# Patient Record
Sex: Male | Born: 2000 | Hispanic: Yes | Marital: Single | Smoking: Never smoker
Health system: Southern US, Community
[De-identification: ages and names within clinical notes are randomized; demographics above are authoritative.]

---
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
## 2018-04-23 ENCOUNTER — Ambulatory Visit (INDEPENDENT_AMBULATORY_CARE_PROVIDER_SITE_OTHER): Payer: Self-pay | Admitting: Family Medicine

## 2018-04-23 ENCOUNTER — Encounter: Payer: Self-pay | Admitting: Family Medicine

## 2018-04-23 VITALS — BP 141/79 | HR 95 | Temp 98.1°F | Resp 17 | Ht 70.0 in | Wt 180.0 lb

## 2018-04-23 DIAGNOSIS — Z711 Person with feared health complaint in whom no diagnosis is made: Secondary | ICD-10-CM

## 2018-04-23 DIAGNOSIS — R030 Elevated blood-pressure reading, without diagnosis of hypertension: Secondary | ICD-10-CM

## 2018-04-23 DIAGNOSIS — F410 Panic disorder [episodic paroxysmal anxiety] without agoraphobia: Secondary | ICD-10-CM

## 2018-04-23 MED ORDER — BUSPIRONE HCL 10 MG PO TABS: 10.0000 mg | ORAL_TABLET | Freq: Two times a day (BID) | ORAL | 0 refills | Status: DC

## 2018-04-23 NOTE — Patient Instructions (Signed)
Thank you for choosing [facility] at [facility] for your medical home!    Marc Campbell was seen by Marc Courts, FNP today.   Marc Campbell's primary care provider  is Marc Neighbors, FNP.   For the best care possible,  you should try to see Marc Courts, FNP  whenever you come to clinic.   We look forward to seeing you again soon!  If you have any questions about your visit today,  please call us at   Or feel free to reach your provider via MyChart.     Panic Attacks Panic attacks are sudden, short feelings of great fear or discomfort. You may have them for no reason when you are relaxed, when you are uneasy (anxious), or when you are sleeping. Follow these instructions at home:  Take all your medicines as told.  Check with your doctor before starting new medicines.  Keep all doctor visits. Contact a doctor if:  You are not able to take your medicines as told.  Your symptoms do not get better.  Your symptoms get worse. Get help right away if:  Your attacks seem different than your normal attacks.  You have thoughts about hurting yourself or others.  You take panic attack medicine and you have a side effect. This information is not intended to replace advice given to you by your health care provider. Make sure you discuss any questions you have with your health care provider. Document Released: 07/20/2010 Document Revised: 11/23/2015 Document Reviewed: 01/29/2013 Elsevier Interactive Patient Education  2017 [employer].    Sexually Transmitted Disease A sexually transmitted disease (STD) is a disease or infection that may be passed (transmitted) from person to person, usually during sexual activity. This may happen by way of saliva, semen, blood, vaginal mucus, or urine. Common STDs include:  Gonorrhea.  Chlamydia.  Syphilis.  HIV and AIDS.  Genital herpes.  Hepatitis B and C.  Trichomonas.  Human papillomavirus (HPV).  Pubic  lice.  Scabies.  Mites.  Bacterial vaginosis.  What are the causes? An STD may be caused by bacteria, a virus, or parasites. STDs are often transmitted during sexual activity if one person is infected. However, they may also be transmitted through nonsexual means. STDs may be transmitted after:  Sexual intercourse with an infected person.  Sharing sex toys with an infected person.  Sharing needles with an infected person or using unclean piercing or tattoo needles.  Having intimate contact with the genitals, mouth, or rectal areas of an infected person.  Exposure to infected fluids during birth.  What are the signs or symptoms? Different STDs have different symptoms. Some people may not have any symptoms. If symptoms are present, they may include:  Painful or bloody urination.  Pain in the pelvis, abdomen, vagina, anus, throat, or eyes.  A skin rash, itching, or irritation.  Growths, ulcerations, blisters, or sores in the genital and anal areas.  Abnormal vaginal discharge with or without bad odor.  Penile discharge in men.  Fever.  Pain or bleeding during sexual intercourse.  Swollen glands in the groin area.  Yellow skin and eyes (jaundice). This is seen with hepatitis.  Swollen testicles.  Infertility.  Sores and blisters in the mouth.  How is this diagnosed? To make a diagnosis, your health care provider may:  Take a medical history.  Perform a physical exam.  Take a sample of any discharge to examine.  Swab the throat, cervix, opening to the penis,  rectum, or vagina for testing.  Test a sample of your first morning urine.  Perform blood tests.  Perform a Pap test, if this applies.  Perform a colposcopy.  Perform a laparoscopy.  How is this treated? Treatment depends on the STD. Some STDs may be treated but not cured.  Chlamydia, gonorrhea, trichomonas, and syphilis can be cured with antibiotic medicine.  Genital herpes, hepatitis, and  HIV can be treated, but not cured, with prescribed medicines. The medicines lessen symptoms.  Genital warts from HPV can be treated with medicine or by freezing, burning (electrocautery), or surgery. Warts may come back.  HPV cannot be cured with medicine or surgery. However, abnormal areas may be removed from the cervix, vagina, or vulva.  If your diagnosis is confirmed, your recent sexual partners need treatment. This is true even if they are symptom-free or have a negative culture or evaluation. They should not have sex until their health care providers say it is okay.  Your health care provider may test you for infection again 3 months after treatment.  How is this prevented? Take these steps to reduce your risk of getting an STD:  Use latex condoms, dental dams, and water-soluble lubricants during sexual activity. Do not use petroleum jelly or oils.  Avoid having multiple sex partners.  Do not have sex with someone who has other sex partners.  Do not have sex with anyone you do not know or who is at high risk for an STD.  Avoid risky sex practices that can break your skin.  Do not have sex if you have open sores on your mouth or skin.  Avoid drinking too much alcohol or taking illegal drugs. Alcohol and drugs can affect your judgment and put you in a vulnerable position.  Avoid engaging in oral and anal sex acts.  Get vaccinated for HPV and hepatitis. If you have not received these vaccines in the past, talk to your health care provider about whether one or both might be right for you.  If you are at risk of being infected with HIV, it is recommended that you take a prescription medicine daily to prevent HIV infection. This is called pre-exposure prophylaxis (PrEP). You are considered at risk if: ? You are a man who has sex with other men (MSM). ? You are a heterosexual man or woman and are sexually active with more than one partner. ? You take drugs by injection. ? You are  sexually active with a partner who has HIV.  Talk with your health care provider about whether you are at high risk of being infected with HIV. If you choose to begin PrEP, you should first be tested for HIV. You should then be tested every 3 months for as long as you are taking PrEP.  Contact a health care provider if:  See your health care provider.  Tell your sexual partner(s). They should be tested and treated for any STDs.  Do not have sex until your health care provider says it is okay. Get help right away if: Contact your health care provider right away if:  You have severe abdominal pain.  You are a man and notice swelling or pain in your testicles.  You are a woman and notice swelling or pain in your vagina.  This information is not intended to replace advice given to you by your health care provider. Make sure you discuss any questions you have with your health care provider. Document Released: 09/07/2002 Document Revised: 01/05/2016  Document Reviewed: 01/05/2013 Elsevier Interactive Patient Education  [employer].

## 2018-04-23 NOTE — Progress Notes (Signed)
Marc Campbell, is a 17 y.o. male  ZOX:[id number]  [id number]:[id number]  DOB - 2000-07-12  CC:  Chief Complaint  Patient presents with  . Establish Care  . Panic Attack    states it happens recently when he went to the beach       HPI: Marc Campbell is a 17 y.o. male is here today to establish care.   ROMEO ZIELINSKI does not have a problem list on file.   Today's visit:  Patient is a very poor historian and is speaking rapidly and required constant redirection to determine the reason for visit today. Need to be screened for STI. Last sexual encounter 2 months ago and experienced dysuria at that time, however no longer symptomatic.Concern panic attacks. He is experiencing rapid thoughts and worry about health. He is sleeping throughout the night. 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 No prior history of depression or anxiety. He denies suicidal or homicidal ideations.Patient denies new headaches, chest pain, abdominal pain, nausea, new weakness , numbness or tingling, SOB, edema, or worrisome cough.   Current medications: Current Outpatient Medications:  .  busPIRone (BUSPAR) 10 MG tablet, Take 1 tablet (10 mg total) by mouth 2 (two) times daily., Disp: 60 tablet, Rfl: 0   Pertinent family medical history: family history is not on file.   No Known Allergies  Social History   Socioeconomic History  . Marital status: Single    Spouse name: Not on file  . Number of children: Not on file  . Years of education: Not on file  . Highest education level: Not on file  Occupational History  . Not on file  Social Needs  . Financial resource strain: Not on file  . Food insecurity:    Worry: Not on file    Inability: Not on file  . Transportation needs:    Medical: Not on file    Non-medical: Not on file  Tobacco Use  . Smoking status: Never Smoker  . Smokeless tobacco: Never Used  Substance and Sexual Activity  . Alcohol use: No  . Drug use: No  . Sexual activity: Never   Lifestyle  . Physical activity:    Days per week: Not on file    Minutes per session: Not on file  . Stress: Not on file  Relationships  . Social connections:    Talks on phone: Not on file    Gets together: Not on file    Attends religious service: Not on file    Active member of club or organization: Not on file    Attends meetings of clubs or organizations: Not on file    Relationship status: Not on file  . Intimate partner violence:    Fear of current or ex partner: Not on file    Emotionally abused: Not on file    Physically abused: Not on file    Forced sexual activity: Not on file  Other Topics Concern  . Not on file  Social History Narrative  . Not on file    Review of Systems: Constitutional: Negative for fever, chills, diaphoresis, activity change, appetite change and fatigue. Cardiovascular: Negative for chest pain, palpitations and leg swelling. Gastrointestinal: Negative for abdominal distention. Genitourinary: Negative for dysuria, urgency, frequency, hematuria, flank pain, decreased urine volume, difficulty urinating. Psychiatric/Behavioral: anxious and worry.panic attacks  Objective:   Vitals:   04/23/18 1353  BP: (!) 141/79  Pulse: 95  Resp: 17  Temp: 98.1 F (36.7 C)  SpO2: 98%  BP Readings from Last 3 Encounters:  04/23/18 (!) 141/79  04/14/17 133/73  03/06/12 98/68 (24 %, Z = -0.72 /  76 %, Z = 0.69)*   *BP percentiles are based on the August 2017 AAP Clinical Practice Guideline for boys    Filed Weights   04/23/18 1353  Weight: 180 lb (81.6 kg)      Physical Exam: Constitutional: Patient appears well-developed and well-nourished. No distress. HENT: Normocephalic, atraumatic, External right and left ear normal. Oropharynx is clear and moist.  Eyes: Conjunctivae and EOM are normal. PERRLA, no scleral icterus. Neck: Normal ROM. Neck supple. No JVD. No tracheal deviation. No thyromegaly. CVS: RRR, S1/S2 +, no murmurs, no gallops, no  carotid bruit.  Pulmonary: Effort and breath sounds normal, no stridor, rhonchi, wheezes, rales.  Abdominal: Soft. BS +, no distension, tenderness, rebound or guarding.  Musculoskeletal: Normal range of motion. No edema and no tenderness.  Skin: Skin is warm and dry. No rash noted. Not diaphoretic. No erythema. No pallor. Psychiatric: inattentiveness, anxious, rapid speech    Assessment and plan:  1. Panic attacks, new reoccurring without an identifiable cause. Trial Buspirone 10 mg twice daily for anxiety. Patient may benefit from SSRI. Given his inability to focus, he would benefit from a psychological consult.  He is uninsured. May benefit from [facility]  2. Concern about STD in male without diagnosis - GC/Chlamydia Probe Amp([facility]) - Urinalysis - HIV antibody (with reflex); Future - RPR; Future  3. Elevated BP without diagnosis of hypertension, will continue to monitor at subsequent follow-up. UA + protein. Will repeat. If protein persists, obtain urine microalbumin.   Meds ordered this encounter  Medications  . busPIRone (BUSPAR) 10 MG tablet    Sig: Take 1 tablet (10 mg total) by mouth 2 (two) times daily.    Dispense:  60 tablet    Refill:  0    Orders Placed This Encounter  Procedures  . GC/Chlamydia Probe Amp([facility])  . Urinalysis  . HIV antibody (with reflex)    Standing Status:   Future    Standing Expiration Date:   04/24/2019  . RPR    Standing Status:   Future    Standing Expiration Date:   04/24/2019    Return in about 6 weeks (around 06/04/2018) for anxiety and blood pressure recheck.  The patient was given clear instructions to go to ER or return to medical center if symptoms don't improve, worsen or new problems develop. The patient verbalized understanding.The patient was advised  to call and obtain lab results if they haven't heard anything from out office within 7-10 business days.    Joaquin Courts, FNP [facility] [address], [place] [address] [phone]: [phone]    This note has been created with Dragon speech recognition software and Paediatric nurse. Any transcriptional errors are unintentional.

## 2018-04-24 LAB — URINALYSIS
BILIRUBIN UA: NEGATIVE
GLUCOSE, UA: NEGATIVE
KETONES UA: NEGATIVE
Leukocytes, UA: NEGATIVE
Nitrite, UA: NEGATIVE
RBC, UA: NEGATIVE
Specific Gravity, UA: 1.025 (ref 1.005–1.030)
UUROB: 1 mg/dL (ref 0.2–1.0)
pH, UA: >=9 — AB (ref 5.0–7.5)

## 2018-04-25 LAB — GC/CHLAMYDIA PROBE AMP
CHLAMYDIA, DNA PROBE: POSITIVE — AB
Neisseria gonorrhoeae by PCR: NEGATIVE

## 2018-04-27 ENCOUNTER — Telehealth: Payer: Self-pay | Admitting: Family Medicine

## 2018-04-27 MED ORDER — AZITHROMYCIN 500 MG PO TABS: 1000.0000 mg | ORAL_TABLET | Freq: Once | ORAL | 0 refills | Status: AC

## 2018-04-27 NOTE — Telephone Encounter (Signed)
Positive for chlamydia. Azithromycin 1 gram sent to pharmacy. Please notify [facility] dept. Patient should advise all sexual partners and they will need treatment. He should uses barrier protection with any future sexual encounters

## 2018-04-27 NOTE — Addendum Note (Signed)
Addended by: Bing Neighbors on: 04/27/2018 07:52 AM   Modules accepted: Orders

## 2018-04-27 NOTE — Telephone Encounter (Signed)
No answer/No VM.  Faxed Communicable Disease Report to [facility] Department([phone])

## 2018-04-28 NOTE — Telephone Encounter (Signed)
No answer. No VM

## 2018-04-30 ENCOUNTER — Ambulatory Visit: Payer: Self-pay | Attending: Family Medicine

## 2018-04-30 DIAGNOSIS — R030 Elevated blood-pressure reading, without diagnosis of hypertension: Secondary | ICD-10-CM

## 2018-04-30 DIAGNOSIS — F410 Panic disorder [episodic paroxysmal anxiety] without agoraphobia: Secondary | ICD-10-CM

## 2018-04-30 DIAGNOSIS — Z711 Person with feared health complaint in whom no diagnosis is made: Secondary | ICD-10-CM

## 2018-04-30 NOTE — Telephone Encounter (Signed)
Patient notified of lab results & recommendations. Expressed understanding. 

## 2018-05-01 ENCOUNTER — Telehealth: Payer: Self-pay | Admitting: Family Medicine

## 2018-05-01 LAB — HIV ANTIBODY (ROUTINE TESTING W REFLEX): HIV Screen 4th Generation wRfx: NONREACTIVE

## 2018-05-01 LAB — THYROID PANEL WITH TSH
FREE THYROXINE INDEX: 1.9 (ref 1.2–4.9)
T3 Uptake Ratio: 27 % (ref 24–39)
T4, Total: 7 ug/dL (ref 4.5–12.0)
TSH: 0.719 u[IU]/mL (ref 0.450–4.500)

## 2018-05-01 LAB — RPR: RPR: NONREACTIVE

## 2018-05-01 NOTE — Telephone Encounter (Signed)
erroneous

## 2018-06-04 ENCOUNTER — Ambulatory Visit: Payer: Self-pay | Admitting: Family Medicine

## 2018-07-08 ENCOUNTER — Ambulatory Visit: Payer: Self-pay | Admitting: Family Medicine

## 2018-07-17 ENCOUNTER — Ambulatory Visit (INDEPENDENT_AMBULATORY_CARE_PROVIDER_SITE_OTHER): Payer: Self-pay | Admitting: Family Medicine

## 2018-07-17 ENCOUNTER — Encounter: Payer: Self-pay | Admitting: Family Medicine

## 2018-07-17 VITALS — BP 129/75 | HR 97 | Resp 18 | Ht 70.0 in | Wt 182.8 lb

## 2018-07-17 DIAGNOSIS — F410 Panic disorder [episodic paroxysmal anxiety] without agoraphobia: Secondary | ICD-10-CM

## 2018-07-17 DIAGNOSIS — R45851 Suicidal ideations: Secondary | ICD-10-CM

## 2018-07-17 DIAGNOSIS — F411 Generalized anxiety disorder: Secondary | ICD-10-CM

## 2018-07-17 DIAGNOSIS — F321 Major depressive disorder, single episode, moderate: Secondary | ICD-10-CM

## 2018-07-17 MED ORDER — BUSPIRONE HCL 10 MG PO TABS: 10.0000 mg | ORAL_TABLET | Freq: Two times a day (BID) | ORAL | 1 refills | Status: DC

## 2018-07-17 MED ORDER — BUPROPION HCL ER (XL) 150 MG PO TB24: 150.0000 mg | ORAL_TABLET | Freq: Every day | ORAL | 1 refills | Status: DC

## 2018-07-17 NOTE — Progress Notes (Signed)
Established Patient Office Visit  Subjective:  Patient ID: Marc Campbell, male    DOB: 2000-08-12  Age: 18 y.o. MRN: [id number]  CC:  Chief Complaint  Patient presents with  . Anxiety    feels like Buspar is working ok but wears off during the day. maybe a higher dose would work better    HPI Marc Campbell presents for anxiety and depression follow-up During last office visit patient was diagnosed and treatment was initiated for anxiety disorder which had been going on previously undiagnosed and untreated.  During discussion today patient revealed that he is also suffering from some depression accompanied by some suicidal ideations during periods of stress.  Today he endorses a positive family history of depression with a 52 year old brother who has recently attempted suicide unsuccessfully.  His brother is currently being treated for anxiety and depression.  He cannot identify any particular triggers or source of the anxiety and depression other than stressors at work.  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 He has difficulty explaining cause of symptoms and reasons he feels suicidal at times. No specific pain for suicide. He continues to take Buspar which he reports helps with symptoms. He is not taking medication as prescribed and takes as needed.  Family History  Problem Relation Age of Onset  . Healthy Mother   . Healthy Father   . Anxiety disorder Brother   . Cancer Neg Hx   . Heart disease Neg Hx   . Stroke Neg Hx     Social History   Socioeconomic History  . Marital status: Single    Spouse name: Not on file  . Number of children: Not on file  . Years of education: Not on file  . Highest education level: Not on file  Occupational History  . Not on file  Social Needs  . Financial resource strain: Not on file  . Food insecurity:    Worry: Not on file    Inability: Not on file  . Transportation needs:    Medical: Not on file     Non-medical: Not on file  Tobacco Use  . Smoking status: Never Smoker  . Smokeless tobacco: Never Used  Substance and Sexual Activity  . Alcohol use: No  . Drug use: No  . Sexual activity: Never  Lifestyle  . Physical activity:    Days per week: Not on file    Minutes per session: Not on file  . Stress: Not on file  Relationships  . Social connections:    Talks on phone: Not on file    Gets together: Not on file    Attends religious service: Not on file    Active member of club or organization: Not on file    Attends meetings of clubs or organizations: Not on file    Relationship status: Not on file  . Intimate partner violence:    Fear of current or ex partner: Not on file    Emotionally abused: Not on file    Physically abused: Not on file    Forced sexual activity: Not on file  Other Topics Concern  . Not on file  Social History Narrative  . Not on file    Outpatient Medications Prior to Visit  Medication Sig Dispense Refill  . busPIRone (BUSPAR) 10 MG tablet Take 1 tablet (10 mg total) by mouth 2 (two) times daily. 60 tablet 0   No facility-administered medications prior to  visit.     No Known Allergies  ROS Review of Systems Pertinent negatives listed in HPI   Objective:    Physical Exam BP 129/75   Pulse 97   Resp 18   Ht 5\' 10"  (1.778 m)   Wt 182 lb 12.8 oz (82.9 kg)   SpO2 98%   BMI 26.23 kg/m    General appearance: alert, well developed, well nourished, cooperative and in no distress Head: Normocephalic, without obvious abnormality, atraumatic Respiratory: Respirations even and unlabored, normal respiratory rate Extremities: No gross deformities Skin: Skin color, texture, turgor normal. No rashes seen  Psych: Inattentiveness, skipping from subject to subject, poor eye contact, rapid speech Neurologic: Mental status: Alert, oriented to person, place, and time, thought content appropriate.  Wt Readings from Last 3 Encounters:  07/17/18 182  lb 12.8 oz (82.9 kg) (83 %, Z= 0.95)*  04/23/18 180 lb (81.6 kg) (81 %, Z= 0.89)*  03/06/12 98 lb 6.4 oz (44.6 kg) (35 %, Z= -0.39)*   * Growth percentiles are based on CDC (Boys, 2-20 Years) data.     Lab Results  Component Value Date   TSH 0.719 04/30/2018     Assessment & Plan:  1. GAD (generalized anxiety disorder) 2. Panic attacks 3. Suicidal ideation 4. Major Depression Disorder  Marc Campbell 18 year old male today is my second encounter with patient. His behavior and ability to focus on subject matter during visit was altered. He was accompanied by a non-English speaking member of his family therefore I was unable to obtain insight into his baseline behavior.  Concerned that patient has a underlying undiagnosed mental health disorder that is landing way to symptoms of generalized anxiety disorder and suicidal ideations.  He has been referred emergently to follow-up with our Child psychotherapist.  Patient is uninsured although I feel he would greatly benefit from evaluation by psychologist and additional diagnostic testing to rule out any underlying mood disorder and or schizoaffective disorder.  For now we will continue buspirone 10 mg 3 times daily and I am adding Wellbutrin 150 mg once daily for depression with suicidal ideations.  A total of 25 minutes spent, greater than 50 % of this time was spent counseling and coordination of care.   Follow-up: Schedule an appointment 07/21/2018 with LCSW and return for medication follow 4 weeks with me   Marc Courts, FNP

## 2018-07-17 NOTE — Patient Instructions (Signed)
Major Depressive Disorder, Adult Major depressive disorder (MDD) is a mental health condition. MDD often makes you feel sad, hopeless, or helpless. MDD can also cause symptoms in your body. MDD can affect your:  Work.  School.  Relationships.  Other normal activities. MDD can range from mild to very bad. It may occur once (single episode MDD). It can also occur many times (recurrent MDD). The main symptoms of MDD often include:  Feeling sad, depressed, or irritable most of the time.  Loss of interest. MDD symptoms also include:  Sleeping too much or too little.  Eating too much or too little.  A change in your weight.  Feeling tired (fatigue) or having low energy.  Feeling worthless.  Feeling guilty.  Trouble making decisions.  Trouble thinking clearly.  Thoughts of suicide or harming others.  Feeling weak.  Feeling agitated.  Keeping yourself from being around other people (isolation). Follow these instructions at home: Activity  Do these things as told by your doctor: ? Go back to your normal activities. ? Exercise regularly. ? Spend time outdoors. Alcohol  Talk with your doctor about how alcohol can affect your antidepressant medicines.  Do not drink alcohol. Or, limit how much alcohol you drink. ? This means no more than 1 drink a day for nonpregnant women and 2 drinks a day for men. One drink equals one of these:  12 oz of beer.  5 oz of wine.  1 oz of hard liquor. General instructions  Take over-the-counter and prescription medicines only as told by your doctor.  Eat a healthy diet.  Get plenty of sleep.  Find activities that you enjoy. Make time to do them.  Think about joining a support group. Your doctor may be able to suggest a group for you.  Keep all follow-up visits as told by your doctor. This is important. Where to find more information:  The [employer] on Mental Illness: ? www.nami.org  [place] [employer] of Mental  Health: ? http://www.maynard.net/  National Suicide Prevention Lifeline: ? 937-019-2318. This is free, 24-hour help. Contact a doctor if:  Your symptoms get worse.  You have new symptoms. Get help right away if:  You self-harm.  You see, hear, taste, smell, or feel things that are not present (hallucinate). If you ever feel like you may hurt yourself or others, or have thoughts about taking your own life, get help right away. You can go to your nearest emergency department or call:  Your local emergency services (911 in the [place]  A suicide crisis helpline, such as the National Suicide Prevention Lifeline: ? 585 344 5737. This is open 24 hours a day. This information is not intended to replace advice given to you by your health care provider. Make sure you discuss any questions you have with your health care provider. Document Released: 05/29/2015 Document Revised: 03/03/2016 Document Reviewed: 03/03/2016 Elsevier Interactive Patient Education  2019 [employer] Inc.    Generalized Anxiety Disorder, Adult Generalized anxiety disorder (GAD) is a mental health disorder. People with this condition constantly worry about everyday events. Unlike normal anxiety, worry related to GAD is not triggered by a specific event. These worries also do not fade or get better with time. GAD interferes with life functions, including relationships, work, and school. GAD can vary from mild to severe. People with severe GAD can have intense waves of anxiety with physical symptoms (panic attacks). What are the causes? The exact cause of GAD is not known. What increases the risk? This condition is  more likely to develop in: Women. People who have a family history of anxiety disorders. People who are very shy. People who experience very stressful life events, such as the death of a loved one. People who have a very stressful family environment. What are the signs or symptoms? People with GAD often worry  excessively about many things in their lives, such as their health and family. They may also be overly concerned about: Doing well at work. Being on time. Natural disasters. Friendships. Physical symptoms of GAD include: Fatigue. Muscle tension or having muscle twitches. Trembling or feeling shaky. Being easily startled. Feeling like your heart is pounding or racing. Feeling out of breath or like you cannot take a deep breath. Having trouble falling asleep or staying asleep. Sweating. Nausea, diarrhea, or irritable bowel syndrome (IBS). Headaches. Trouble concentrating or remembering facts. Restlessness. Irritability. How is this diagnosed? Your health care provider can diagnose GAD based on your symptoms and medical history. You will also have a physical exam. The health care provider will ask specific questions about your symptoms, including how severe they are, when they started, and if they come and go. Your health care provider may ask you about your use of alcohol or drugs, including prescription medicines. Your health care provider may refer you to a mental health specialist for further evaluation. Your health care provider will do a thorough examination and may perform additional tests to rule out other possible causes of your symptoms. To be diagnosed with GAD, a person must have anxiety that: Is out of his or her control. Affects several different aspects of his or her life, such as work and relationships. Causes distress that makes him or her unable to take part in normal activities. Includes at least three physical symptoms of GAD, such as restlessness, fatigue, trouble concentrating, irritability, muscle tension, or sleep problems. Before your health care provider can confirm a diagnosis of GAD, these symptoms must be present more days than they are not, and they must last for six months or longer. How is this treated? The following therapies are usually used to treat  GAD: Medicine. Antidepressant medicine is usually prescribed for long-term daily control. Antianxiety medicines may be added in severe cases, especially when panic attacks occur. Talk therapy (psychotherapy). Certain types of talk therapy can be helpful in treating GAD by providing support, education, and guidance. Options include: Cognitive behavioral therapy (CBT). People learn coping skills and techniques to ease their anxiety. They learn to identify unrealistic or negative thoughts and behaviors and to replace them with positive ones. Acceptance and commitment therapy (ACT). This treatment teaches people how to be mindful as a way to cope with unwanted thoughts and feelings. Biofeedback. This process trains you to manage your body's response (physiological response) through breathing techniques and relaxation methods. You will work with a therapist while machines are used to monitor your physical symptoms. Stress management techniques. These include yoga, meditation, and exercise. A mental health specialist can help determine which treatment is best for you. Some people see improvement with one type of therapy. However, other people require a combination of therapies. Follow these instructions at home: Take over-the-counter and prescription medicines only as told by your health care provider. Try to maintain a normal routine. Try to anticipate stressful situations and allow extra time to manage them. Practice any stress management or self-calming techniques as taught by your health care provider. Do not punish yourself for setbacks or for not making progress. Try to recognize your accomplishments,  even if they are small. Keep all follow-up visits as told by your health care provider. This is important. Contact a health care provider if: Your symptoms do not get better. Your symptoms get worse. You have signs of depression, such as: A persistently sad, cranky, or irritable mood. Loss of  enjoyment in activities that used to bring you joy. Change in weight or eating. Changes in sleeping habits. Avoiding friends or family members. Loss of energy for normal tasks. Feelings of guilt or worthlessness. Get help right away if: You have serious thoughts about hurting yourself or others. If you ever feel like you may hurt yourself or others, or have thoughts about taking your own life, get help right away. You can go to your nearest emergency department or call: Your local emergency services (911 in the [place] A suicide crisis helpline, such as the National Suicide Prevention Lifeline at 423-791-1598. This is open 24 hours a day. Summary Generalized anxiety disorder (GAD) is a mental health disorder that involves worry that is not triggered by a specific event. People with GAD often worry excessively about many things in their lives, such as their health and family. GAD may cause physical symptoms such as restlessness, trouble concentrating, sleep problems, frequent sweating, nausea, diarrhea, headaches, and trembling or muscle twitching. A mental health specialist can help determine which treatment is best for you. Some people see improvement with one type of therapy. However, other people require a combination of therapies. This information is not intended to replace advice given to you by your health care provider. Make sure you discuss any questions you have with your health care provider. Document Released: 10/12/2012 Document Revised: 05/07/2016 Document Reviewed: 05/07/2016 Elsevier Interactive Patient Education  2019 [employer].

## 2018-07-21 ENCOUNTER — Ambulatory Visit (INDEPENDENT_AMBULATORY_CARE_PROVIDER_SITE_OTHER): Payer: Self-pay | Admitting: Licensed Clinical Social Worker

## 2018-07-21 DIAGNOSIS — F331 Major depressive disorder, recurrent, moderate: Secondary | ICD-10-CM

## 2018-07-21 DIAGNOSIS — F419 Anxiety disorder, unspecified: Secondary | ICD-10-CM

## 2018-07-21 NOTE — BH Specialist Note (Signed)
Integrated Behavioral Health Initial Visit  MRN: [id number] Name: KOHLTON BURGERS  Number of Integrated Behavioral Health Clinician visits:: 1/6 Session Start time: 2:56 PM  Session End time: 3:20 PM Total time: 24 minutes  Type of Service: Integrated Behavioral Health- Individual/Family Interpretor:No. Interpretor Name and Language: N/A   SUBJECTIVE: VLADIMIR MICHONSKI is a 18 y.o. male accompanied by self Patient was referred by FNP Tiburcio Pea for depression and anxiety. Patient reports the following symptoms/concerns: Pt reports onset of anxiety and depression triggered from stress, primarily with work. Duration of problem: weeks; Severity of problem: severe  OBJECTIVE: Mood: Anxious and Depressed and Affect: Appropriate Risk of harm to self or others: No plan to harm self or others Pt scored positive on phq9 on 07/17/2018; however, denies current SI/HI/AVH. Protective factors identified, safety plan discussed, and crisis intervention resources provided  LIFE CONTEXT: Family and Social: Pt resides with family. School/Work: Pt is employed Self-Care: Pt participates in medication management through PCP Life Changes: Pt reports onset of anxiety and depression triggered from stress, primarily with work.  GOALS ADDRESSED: Patient will: 1. Reduce symptoms of: anxiety, depression and stress 2. Increase knowledge and/or ability of: coping skills and healthy habits  3. Demonstrate ability to: Increase healthy adjustment to current life circumstances, Increase adequate support systems for patient/family and Increase motivation to adhere to plan of care  INTERVENTIONS: Interventions utilized: Solution-Focused Strategies, Supportive Counseling, Psychoeducation and/or Health Education and Link to [facility]  Standardized Assessments completed: C-SSRS Short  ASSESSMENT: Patient currently experiencing depression and anxiety triggered from stress, primarily with work. He reports difficulty  managing racing thoughts, irritability, inability to concentrate, and stress. Receives support from family. Pt scored positive on phq9 on 07/17/2018; however, denies current SI/HI/AVH. Protective factors identified, safety plan discussed, and crisis intervention resources provided   Patient may benefit from psychotherapy. LCSWA educated pt on the correlation between one's physical and mental health, in addition, to how stress can negatively impact both. Therapeutic interventions were discussed to assist with decreasing and/or management of symptoms. Pt participates in medication management through PCP.   PLAN: 1. Follow up with behavioral health clinician on : Pt was encouraged to contact LCSWA if symptoms worsen or fail to improve to schedule behavioral appointments at [facility]. 2. Behavioral recommendations: LCSWA recommends that pt apply healthy coping skills discussed, comply with medication management, and utilize provided resources. Pt is encouraged to schedule follow up appointment with LCSWA 3. Referral(s): Integrated Art gallery manager (In Clinic) and [facility] (LME/Outside Clinic) 4. "From scale of 1-10, how likely are you to follow plan?":   Bridgett Larsson, LCSW 07/24/2018 2:32 PM

## 2018-08-18 ENCOUNTER — Ambulatory Visit (INDEPENDENT_AMBULATORY_CARE_PROVIDER_SITE_OTHER): Payer: Self-pay | Admitting: Family Medicine

## 2018-08-18 ENCOUNTER — Encounter: Payer: Self-pay | Admitting: Family Medicine

## 2018-08-18 VITALS — BP 133/84 | HR 81 | Resp 17 | Ht 70.0 in | Wt 180.8 lb

## 2018-08-18 DIAGNOSIS — R030 Elevated blood-pressure reading, without diagnosis of hypertension: Secondary | ICD-10-CM

## 2018-08-18 DIAGNOSIS — F411 Generalized anxiety disorder: Secondary | ICD-10-CM

## 2018-08-18 DIAGNOSIS — Z113 Encounter for screening for infections with a predominantly sexual mode of transmission: Secondary | ICD-10-CM

## 2018-08-18 DIAGNOSIS — F331 Major depressive disorder, recurrent, moderate: Secondary | ICD-10-CM

## 2018-08-18 MED ORDER — BUSPIRONE HCL 10 MG PO TABS: 10.0000 mg | ORAL_TABLET | Freq: Two times a day (BID) | ORAL | 2 refills | Status: DC

## 2018-08-18 MED ORDER — BUPROPION HCL ER (XL) 150 MG PO TB24: 150.0000 mg | ORAL_TABLET | Freq: Every day | ORAL | 2 refills | Status: DC

## 2018-08-18 NOTE — Progress Notes (Signed)
Established Patient Office Visit  Subjective:  Patient ID: Marc Campbell, male    DOB: 28-Feb-2001  Age: 18 y.o. MRN: [id number]  CC:  Chief Complaint  Patient presents with  . Depression  . Anxiety    HPI Marc Campbell presents for depression and anxiety follow-up.  Depression and anxiety Patient recently started on Wellbutrin and continues on Buspirone for anxiety and depression symptoms.  Reports today significant improvement of symptoms. He endorses improved energy, ability to focus, and able to manage work-related stress.  Reports that he is definitely able to tell a difference in his overall mood since starting the medication.  He denies any untoward side effects.  Reports good sleeping.  Denies harmful thoughts, suicidal thoughts, homicidal ideations, or auditory hallucinations.  No recent changes in appetite.  Elevated BP without a diagnosis of Hypertension  Patient has been seen here in office over the course of 3 visits. He has had elevated BP readings at 2/3. Endorses a family history significant of elevated BP -brother. He admits to high sodium intake and inactivity. Current Body mass index is 25.94 kg/m.  Denies chest pain, shortness of breath, dizziness, or weakness.  Family History  Problem Relation Age of Onset  . Healthy Mother   . Healthy Father   . Anxiety disorder Brother   . Cancer Neg Hx   . Heart disease Neg Hx   . Stroke Neg Hx     Social History   Socioeconomic History  . Marital status: Single    Spouse name: Not on file  . Number of children: Not on file  . Years of education: Not on file  . Highest education level: Not on file  Occupational History  . Not on file  Social Needs  . Financial resource strain: Not on file  . Food insecurity:    Worry: Not on file    Inability: Not on file  . Transportation needs:    Medical: Not on file    Non-medical: Not on file  Tobacco Use  . Smoking status: Never Smoker  . Smokeless tobacco: Never  Used  Substance and Sexual Activity  . Alcohol use: No  . Drug use: No  . Sexual activity: Never  Lifestyle  . Physical activity:    Days per week: Not on file    Minutes per session: Not on file  . Stress: Not on file  Relationships  . Social connections:    Talks on phone: Not on file    Gets together: Not on file    Attends religious service: Not on file    Active member of club or organization: Not on file    Attends meetings of clubs or organizations: Not on file    Relationship status: Not on file  . Intimate partner violence:    Fear of current or ex partner: Not on file    Emotionally abused: Not on file    Physically abused: Not on file    Forced sexual activity: Not on file  Other Topics Concern  . Not on file  Social History Narrative  . Not on file    Outpatient Medications Prior to Visit  Medication Sig Dispense Refill  . buPROPion (WELLBUTRIN XL) 150 MG 24 hr tablet Take 1 tablet (150 mg total) by mouth daily. 60 tablet 1  . busPIRone (BUSPAR) 10 MG tablet Take 1 tablet (10 mg total) by mouth 2 (two) times daily. 60 tablet 1   No facility-administered medications prior  to visit.     No Known Allergies  ROS Review of Systems Pertinent negatives listed in HPI   Objective:    Physical Exam BP 133/84   Pulse 81   Resp 17   Ht 5\' 10"  (1.778 m)   Wt 180 lb 12.8 oz (82 kg)   SpO2 99%   BMI 25.94 kg/m    General appearance: alert, well developed, well nourished, cooperative and in no distress Head: Normocephalic, without obvious abnormality, atraumatic Respiratory: Respirations even and unlabored, normal respiratory rate Extremities: No gross deformities Skin: Skin color, texture, turgor normal. No rashes seen  Psych: Appropriate mood and affect.  Appropriate judgment.  Appropriate speech. Neurologic: Mental status: Alert, oriented to person, place, and time, thought content appropriate. Wt Readings from Last 3 Encounters:  08/18/18 180 lb 12.8 oz  (82 kg) (81 %, Z= 0.88)*  07/17/18 182 lb 12.8 oz (82.9 kg) (83 %, Z= 0.95)*  04/23/18 180 lb (81.6 kg) (81 %, Z= 0.89)*   * Growth percentiles are based on CDC (Boys, 2-20 Years) data.    Lab Results  Component Value Date   TSH 0.719 04/30/2018    Assessment & Plan:  1. Moderate episode of recurrent major depressive disorder (HCC) 2. GAD (generalized anxiety disorder) Continue Wellbutrin and buspirone as prescribed. Gad 7  Improved  to 5 from previous 8 and PHQ 9 improved 3 from previous 8 Depression screen [facility] 2/9 07/17/2018 04/23/2018  Decreased Interest 2 0  Down, Depressed, Hopeless 1 3  PHQ - 2 Score 3 3  Altered sleeping 0 0  Tired, decreased energy 2 1  Change in appetite 0 1  Feeling bad or failure about yourself  1 3  Trouble concentrating 0 0  Moving slowly or fidgety/restless 0 0  Suicidal thoughts 2 0  PHQ-9 Score 8 8     GAD 7 : Generalized Anxiety Score 07/17/2018 04/23/2018  Nervous, Anxious, on Edge - 1  Control/stop worrying 2 1  Worry too much - different things 1 1  Trouble relaxing 2 1  Restless 1 1  Easily annoyed or irritable 2 1  Afraid - awful might happen 1 2  Total GAD 7 Score - 8     3. Screen for STD (sexually transmitted disease) - GC/Chlamydia Probe Amp([facility]); Future  4. Elevated BP without diagnosis of hypertension Continue to monitor blood pressure for now. Encourage reduction of sodium rich foods and encourage food label reading.  Counseled on DASH diet with a goal of 2000 mg of sodium intake per day and/or less than 3000 mg of sodium per day. Encouraged routine physical activity the goal of 150 minutes/week.  Meds ordered this encounter  Medications  . busPIRone (BUSPAR) 10 MG tablet    Sig: Take 1 tablet (10 mg total) by mouth 2 (two) times daily.    Dispense:  60 tablet    Refill:  2  . buPROPion (WELLBUTRIN XL) 150 MG 24 hr tablet    Sig: Take 1 tablet (150 mg total) by mouth daily.    Dispense:  60 tablet    Refill:   2    Follow-up: Return in about 4 months (around 12/17/2018) for GAD and Major depression disorder -Return to leave urine sample .    Joaquin Courts, FNP -C

## 2018-08-18 NOTE — Patient Instructions (Addendum)
Glad symptoms have improved with current medication.  Please return to leave urine sample for STD screen.     Living With Anxiety  After being diagnosed with an anxiety disorder, you may be relieved to know why you have felt or behaved a certain way. It is natural to also feel overwhelmed about the treatment ahead and what it will mean for your life. With care and support, you can manage this condition and recover from it. How to cope with anxiety Dealing with stress Stress is your body's reaction to life changes and events, both good and bad. Stress can last just a few hours or it can be ongoing. Stress can play a major role in anxiety, so it is important to learn both how to cope with stress and how to think about it differently. Talk with your health care provider or a counselor to learn more about stress reduction. He or she may suggest some stress reduction techniques, such as:  Music therapy. This can include creating or listening to music that you enjoy and that inspires you.  Mindfulness-based meditation. This involves being aware of your normal breaths, rather than trying to control your breathing. It can be done while sitting or walking.  Centering prayer. This is a kind of meditation that involves focusing on a word, phrase, or sacred image that is meaningful to you and that brings you peace.  Deep breathing. To do this, expand your stomach and inhale slowly through your nose. Hold your breath for 3-5 seconds. Then exhale slowly, allowing your stomach muscles to relax.  Self-talk. This is a skill where you identify thought patterns that lead to anxiety reactions and correct those thoughts.  Muscle relaxation. This involves tensing muscles then relaxing them. Choose a stress reduction technique that fits your lifestyle and personality. Stress reduction techniques take time and practice. Set aside 5-15 minutes a day to do them. Therapists can offer training in these techniques. The  training may be covered by some insurance plans. Other things you can do to manage stress include:  Keeping a stress diary. This can help you learn what triggers your stress and ways to control your response.  Thinking about how you respond to certain situations. You may not be able to control everything, but you can control your reaction.  Making time for activities that help you relax, and not feeling guilty about spending your time in this way. Therapy combined with coping and stress-reduction skills provides the best chance for successful treatment. Medicines Medicines can help ease symptoms. Medicines for anxiety include:  Anti-anxiety drugs.  Antidepressants.  Beta-blockers. Medicines may be used as the main treatment for anxiety disorder, along with therapy, or if other treatments are not working. Medicines should be prescribed by a health care provider. Relationships Relationships can play a big part in helping you recover. Try to spend more time connecting with trusted friends and family members. Consider going to couples counseling, taking family education classes, or going to family therapy. Therapy can help you and others better understand the condition. How to recognize changes in your condition Everyone has a different response to treatment for anxiety. Recovery from anxiety happens when symptoms decrease and stop interfering with your daily activities at home or work. This may mean that you will start to:  Have better concentration and focus.  Sleep better.  Be less irritable.  Have more energy.  Have improved memory. It is important to recognize when your condition is getting worse. Contact your health  care provider if your symptoms interfere with home or work and you do not feel like your condition is improving. Where to find help and support: You can get help and support from these sources:  Self-help groups.  Online and [employer].  A trusted  spiritual leader.  Couples counseling.  Family education classes.  Family therapy. Follow these instructions at home:  Eat a healthy diet that includes plenty of vegetables, fruits, whole grains, low-fat dairy products, and lean protein. Do not eat a lot of foods that are high in solid fats, added sugars, or salt.  Exercise. Most adults should do the following: ? Exercise for at least 150 minutes each week. The exercise should increase your heart rate and make you sweat (moderate-intensity exercise). ? Strengthening exercises at least twice a week.  Cut down on caffeine, tobacco, alcohol, and other potentially harmful substances.  Get the right amount and quality of sleep. Most adults need 7-9 hours of sleep each night.  Make choices that simplify your life.  Take over-the-counter and prescription medicines only as told by your health care provider.  Avoid caffeine, alcohol, and certain over-the-counter cold medicines. These may make you feel worse. Ask your pharmacist which medicines to avoid.  Keep all follow-up visits as told by your health care provider. This is important. Questions to ask your health care provider  Would I benefit from therapy?  How often should I follow up with a health care provider?  How long do I need to take medicine?  Are there any long-term side effects of my medicine?  Are there any alternatives to taking medicine? Contact a health care provider if:  You have a hard time staying focused or finishing daily tasks.  You spend many hours a day feeling worried about everyday life.  You become exhausted by worry.  You start to have headaches, feel tense, or have nausea.  You urinate more than normal.  You have diarrhea. Get help right away if:  You have a racing heart and shortness of breath.  You have thoughts of hurting yourself or others. If you ever feel like you may hurt yourself or others, or have thoughts about taking your own  life, get help right away. You can go to your nearest emergency department or call:  Your local emergency services (911 in the [place]  A suicide crisis helpline, such as the National Suicide Prevention Lifeline at 270 317 3183. This is open 24-hours a day. Summary  Taking steps to deal with stress can help calm you.  Medicines cannot cure anxiety disorders, but they can help ease symptoms.  Family, friends, and partners can play a big part in helping you recover from an anxiety disorder. This information is not intended to replace advice given to you by your health care provider. Make sure you discuss any questions you have with your health care provider. Document Released: 06/11/2016 Document Revised: 06/11/2016 Document Reviewed: 06/11/2016 Elsevier Interactive Patient Education  2019 [employer].

## 2018-08-24 NOTE — Addendum Note (Signed)
Addended by: Heidi Dach on: 08/24/2018 04:21 PM   Modules accepted: Orders

## 2018-08-26 LAB — GC/CHLAMYDIA PROBE AMP
Chlamydia trachomatis, NAA: NEGATIVE
Neisseria gonorrhoeae by PCR: NEGATIVE

## 2018-08-28 ENCOUNTER — Telehealth: Payer: Self-pay | Admitting: Family Medicine

## 2018-08-28 NOTE — Telephone Encounter (Signed)
Patient called requesting lab results, please follow up °

## 2018-08-28 NOTE — Telephone Encounter (Signed)
Left voice mail to call back 

## 2018-08-28 NOTE — Telephone Encounter (Signed)
Please disregard previous message, it was meant to be sent to [facility].

## 2018-09-02 NOTE — Progress Notes (Signed)
Patient notified of results & recommendations. Expressed understanding.

## 2018-09-03 NOTE — Telephone Encounter (Signed)
Notes recorded by Heidi Dach, CMA on 09/02/2018 at 5:00 PM EST Patient notified of results & recommendations. Expressed understanding.

## 2018-12-17 ENCOUNTER — Telehealth: Payer: Self-pay | Admitting: Family Medicine
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
## 2019-04-27 ENCOUNTER — Other Ambulatory Visit: Payer: Self-pay

## 2019-04-27 ENCOUNTER — Encounter (INDEPENDENT_AMBULATORY_CARE_PROVIDER_SITE_OTHER): Payer: Self-pay | Admitting: Primary Care

## 2019-04-27 ENCOUNTER — Ambulatory Visit (INDEPENDENT_AMBULATORY_CARE_PROVIDER_SITE_OTHER): Payer: Self-pay | Admitting: Primary Care

## 2019-04-27 VITALS — BP 156/77 | HR 84 | Temp 97.5°F | Ht 70.0 in | Wt 178.6 lb

## 2019-04-27 DIAGNOSIS — F331 Major depressive disorder, recurrent, moderate: Secondary | ICD-10-CM

## 2019-04-27 DIAGNOSIS — I10 Essential (primary) hypertension: Secondary | ICD-10-CM

## 2019-04-27 MED ORDER — BUPROPION HCL ER (XL) 150 MG PO TB24: 150.0000 mg | ORAL_TABLET | Freq: Every day | ORAL | 3 refills | Status: DC

## 2019-04-27 MED ORDER — HYDROCHLOROTHIAZIDE 25 MG PO TABS: 25.0000 mg | ORAL_TABLET | Freq: Every day | ORAL | 3 refills | Status: DC

## 2019-04-27 MED ORDER — BUSPIRONE HCL 10 MG PO TABS: 10.0000 mg | ORAL_TABLET | Freq: Two times a day (BID) | ORAL | 3 refills | Status: DC

## 2019-04-27 NOTE — Patient Instructions (Signed)

## 2019-04-27 NOTE — Progress Notes (Signed)
New Campbell Office Visit  Subjective:  Campbell ID: Marc Campbell, male    DOB: 08-Feb-2001  Age: 18 y.o. MRN: [id number]  CC:  Chief Complaint  Campbell presents with  . New Campbell (Initial Visit)    anxiety/depression     HPI Marc Campbell presents for establishment of care for new provider. Marc Campbell was previously establish with Primary care at [facility].   Marc Campbell is being seen for anxiety and depression and requesting refills. Also, schedule to see CSW.  History reviewed. No pertinent past medical history.  History reviewed. No pertinent surgical history.  Family History  Problem Relation Age of Onset  . Healthy Mother   . Healthy Father   . Anxiety disorder Brother   . Cancer Neg Hx   . Heart disease Neg Hx   . Stroke Neg Hx     Social History   Socioeconomic History  . Marital status: Single    Spouse name: Not on file  . Number of children: Not on file  . Years of education: Not on file  . Highest education level: Not on file  Occupational History  . Not on file  Social Needs  . Financial resource strain: Not on file  . Food insecurity    Worry: Not on file    Inability: Not on file  . Transportation needs    Medical: Not on file    Non-medical: Not on file  Tobacco Use  . Smoking status: Never Smoker  . Smokeless tobacco: Never Used  Substance and Sexual Activity  . Alcohol use: No  . Drug use: No  . Sexual activity: Never  Lifestyle  . Physical activity    Days per week: Not on file    Minutes per session: Not on file  . Stress: Not on file  Relationships  . Social Herbalist on phone: Not on file    Gets together: Not on file    Attends religious service: Not on file    Active member of club or organization: Not on file    Attends meetings of clubs or organizations: Not on file    Relationship status: Not on file  . Intimate partner violence    Fear of current or ex partner: Not on file    Emotionally abused: Not on file    Physically  abused: Not on file    Forced sexual activity: Not on file  Other Topics Concern  . Not on file  Social History Narrative  . Not on file    ROS Review of Systems  Psychiatric/Behavioral: Marc Campbell is nervous/anxious.        Depression  All other systems reviewed and are negative.   Objective:   Today's Vitals: BP (!) 156/77 (BP Location: Right Arm, Campbell Position: Sitting, Cuff Size: Normal)   Pulse 84   Temp (!) 97.5 F (36.4 C) (Temporal)   Ht 5' 10"  (1.778 m)   Wt 178 lb 9.6 oz (81 kg)   SpO2 98%   BMI 25.63 kg/m   Physical Exam Vitals signs reviewed.  Constitutional:      Appearance: Normal appearance.  HENT:     Head: Normocephalic.     Right Ear: Tympanic membrane normal.     Left Ear: Tympanic membrane normal.  Neck:     Musculoskeletal: Normal range of motion.  Cardiovascular:     Rate and Rhythm: Normal rate and regular rhythm.  Pulmonary:     Effort: Pulmonary  effort is normal.     Breath sounds: Normal breath sounds.  Abdominal:     General: Abdomen is flat.     Palpations: Abdomen is soft.  Skin:    General: Skin is warm and dry.  Neurological:     General: No focal deficit present.     Mental Status: Marc Campbell is alert.  Psychiatric:        Mood and Affect: Mood normal.        Behavior: Behavior normal.        Thought Content: Thought content normal.        Judgment: Judgment normal.     Assessment & Plan:   Marc Campbell was seen today for new Campbell (initial visit).  Diagnoses and all orders for this visit:  Moderate episode of recurrent major depressive disorder ([place])   Marc Campbell is experiencing increase anxiety and depression requesting refills on medication. When Marc Campbell was taking medication daily Marc Campbell felt they were working.  -     buPROPion (WELLBUTRIN XL) 150 MG 24 hr tablet; Take 1 tablet (150 mg total) by mouth daily. -     busPIRone (BUSPAR) 10 MG tablet; Take 1 tablet (10 mg total) by mouth 2 (two) times daily.  Essential hypertension Bp goal is  130/80 and discussing low-sodium diet and 150 minutes of moderate intensity exercise per week. Discussed medication compliance, adverse effects. -     CBC with Differential -     CMP14+EGFR  Other orders -     hydrochlorothiazide (HYDRODIURIL) 25 MG tablet; Take 1 tablet (25 mg total) by mouth daily.    Outpatient Encounter Medications as of 04/27/2019  Medication Sig  . buPROPion (WELLBUTRIN XL) 150 MG 24 hr tablet Take 1 tablet (150 mg total) by mouth daily.  . busPIRone (BUSPAR) 10 MG tablet Take 1 tablet (10 mg total) by mouth 2 (two) times daily.  . hydrochlorothiazide (HYDRODIURIL) 25 MG tablet Take 1 tablet (25 mg total) by mouth daily.  . [DISCONTINUED] buPROPion (WELLBUTRIN XL) 150 MG 24 hr tablet Take 1 tablet (150 mg total) by mouth daily. (Campbell not taking: Reported on 04/27/2019)  . [DISCONTINUED] busPIRone (BUSPAR) 10 MG tablet Take 1 tablet (10 mg total) by mouth 2 (two) times daily. (Campbell not taking: Reported on 04/27/2019)   No facility-administered encounter medications on file as of 04/27/2019.     Follow-up: Return for HTN ( 6 weeks -2 months in person.Kerin Perna, NP

## 2019-04-28 LAB — CBC WITH DIFFERENTIAL/PLATELET
Basophils Absolute: 0.1 10*3/uL (ref 0.0–0.2)
Basos: 1 %
EOS (ABSOLUTE): 0.1 10*3/uL (ref 0.0–0.4)
Eos: 1 %
Hematocrit: 45.4 % (ref 37.5–51.0)
Hemoglobin: 15.1 g/dL (ref 13.0–17.7)
Immature Grans (Abs): 0 10*3/uL (ref 0.0–0.1)
Immature Granulocytes: 0 %
Lymphocytes Absolute: 2.2 10*3/uL (ref 0.7–3.1)
Lymphs: 32 %
MCH: 28.6 pg (ref 26.6–33.0)
MCHC: 33.3 g/dL (ref 31.5–35.7)
MCV: 86 fL (ref 79–97)
Monocytes Absolute: 0.5 10*3/uL (ref 0.1–0.9)
Monocytes: 7 %
Neutrophils Absolute: 4 10*3/uL (ref 1.4–7.0)
Neutrophils: 59 %
Platelets: 310 10*3/uL (ref 150–450)
RBC: 5.28 x10E6/uL (ref 4.14–5.80)
RDW: 12.7 % (ref 11.6–15.4)
WBC: 6.8 10*3/uL (ref 3.4–10.8)

## 2019-04-28 LAB — CMP14+EGFR
ALT: 22 IU/L (ref 0–44)
AST: 18 IU/L (ref 0–40)
Albumin/Globulin Ratio: 1.9 (ref 1.2–2.2)
Albumin: 4.9 g/dL (ref 4.1–5.2)
Alkaline Phosphatase: 124 IU/L — ABNORMAL HIGH (ref 39–117)
BUN/Creatinine Ratio: 13 (ref 9–20)
BUN: 14 mg/dL (ref 6–20)
Bilirubin Total: 0.4 mg/dL (ref 0.0–1.2)
CO2: 25 mmol/L (ref 20–29)
Calcium: 9.9 mg/dL (ref 8.7–10.2)
Chloride: 102 mmol/L (ref 96–106)
Creatinine, Ser: 1.05 mg/dL (ref 0.76–1.27)
GFR calc Af Amer: 118 mL/min/{1.73_m2} (ref >59)
GFR calc non Af Amer: 102 mL/min/{1.73_m2} (ref >59)
Globulin, Total: 2.6 g/dL (ref 1.5–4.5)
Glucose: 94 mg/dL (ref 65–99)
Potassium: 4.3 mmol/L (ref 3.5–5.2)
Sodium: 141 mmol/L (ref 134–144)
Total Protein: 7.5 g/dL (ref 6.0–8.5)

## 2019-06-08 ENCOUNTER — Ambulatory Visit (INDEPENDENT_AMBULATORY_CARE_PROVIDER_SITE_OTHER): Payer: Self-pay | Admitting: Licensed Clinical Social Worker

## 2019-06-08 ENCOUNTER — Other Ambulatory Visit (HOSPITAL_COMMUNITY)
Admission: RE | Admit: 2019-06-08 | Discharge: 2019-06-08 | Disposition: A | Discharge status: Home / Self Care | Payer: Self-pay | Source: Ambulatory Visit | Attending: Primary Care | Admitting: Primary Care

## 2019-06-08 ENCOUNTER — Other Ambulatory Visit: Payer: Self-pay

## 2019-06-08 ENCOUNTER — Encounter (INDEPENDENT_AMBULATORY_CARE_PROVIDER_SITE_OTHER): Payer: Self-pay | Admitting: Primary Care

## 2019-06-08 ENCOUNTER — Ambulatory Visit (INDEPENDENT_AMBULATORY_CARE_PROVIDER_SITE_OTHER): Payer: Self-pay | Admitting: Primary Care

## 2019-06-08 VITALS — BP 123/63 | HR 103 | Temp 97.1°F | Ht 70.0 in | Wt 178.8 lb

## 2019-06-08 DIAGNOSIS — Z113 Encounter for screening for infections with a predominantly sexual mode of transmission: Secondary | ICD-10-CM

## 2019-06-08 DIAGNOSIS — I10 Essential (primary) hypertension: Secondary | ICD-10-CM

## 2019-06-08 NOTE — Patient Instructions (Signed)

## 2019-06-11 LAB — URINE CYTOLOGY ANCILLARY ONLY
Chlamydia: NEGATIVE
Comment: NEGATIVE
Comment: NEGATIVE
Comment: NORMAL
Neisseria Gonorrhea: NEGATIVE
Trichomonas: NEGATIVE

## 2019-06-13 ENCOUNTER — Other Ambulatory Visit: Payer: Self-pay

## 2019-06-13 ENCOUNTER — Emergency Department (HOSPITAL_COMMUNITY)
Admission: EM | Admit: 2019-06-13 | Discharge: 2019-06-14 | Disposition: A | Discharge status: Home / Self Care | Payer: Self-pay | Attending: Emergency Medicine | Admitting: Emergency Medicine

## 2019-06-13 ENCOUNTER — Encounter (HOSPITAL_COMMUNITY): Payer: Self-pay | Admitting: Emergency Medicine

## 2019-06-13 DIAGNOSIS — Y92410 Unspecified street and highway as the place of occurrence of the external cause: Secondary | ICD-10-CM | POA: Insufficient documentation

## 2019-06-13 DIAGNOSIS — Y93I9 Activity, other involving external motion: Secondary | ICD-10-CM | POA: Insufficient documentation

## 2019-06-13 DIAGNOSIS — Z79899 Other long term (current) drug therapy: Secondary | ICD-10-CM | POA: Insufficient documentation

## 2019-06-13 DIAGNOSIS — S0083XA Contusion of other part of head, initial encounter: Secondary | ICD-10-CM | POA: Insufficient documentation

## 2019-06-13 DIAGNOSIS — Y999 Unspecified external cause status: Secondary | ICD-10-CM | POA: Insufficient documentation

## 2019-06-13 DIAGNOSIS — S0003XA Contusion of scalp, initial encounter: Secondary | ICD-10-CM | POA: Insufficient documentation

## 2019-06-13 DIAGNOSIS — R0789 Other chest pain: Secondary | ICD-10-CM | POA: Insufficient documentation
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
## 2019-06-14 ENCOUNTER — Emergency Department (HOSPITAL_COMMUNITY): Payer: Self-pay

## 2019-06-14 MED ORDER — IBUPROFEN 200 MG PO TABS
600.0000 mg | ORAL_TABLET | Freq: Once | ORAL | Status: AC
  Administered 2019-06-14: 600 mg via ORAL
  Filled 2019-06-14: qty 1
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
## 2019-06-14 NOTE — ED Notes (Signed)
Pt transported to xray 

## 2019-06-20 NOTE — Progress Notes (Addendum)
Established Patient Office Visit  Subjective:  Patient ID: Marc Campbell, male    DOB: 2001-02-20  Age: 18 y.o. MRN: [id number]  CC:  Chief Complaint  Patient presents with  . Follow-up    HTN    HPI [name] is a 18 year old male who presents for the management of her blood pressure denies shortness of breath, headaches, chest pain or lower extremity edema.  Her other comlaint is vaginal discharge.   History reviewed. No pertinent past medical history.  History reviewed. No pertinent surgical history.  Family History  Problem Relation Age of Onset  . Healthy Mother   . Healthy Father   . Anxiety disorder Brother   . Cancer Neg Hx   . Heart disease Neg Hx   . Stroke Neg Hx     Social History   Socioeconomic History  . Marital status: Single    Spouse name: Not on file  . Number of children: Not on file  . Years of education: Not on file  . Highest education level: Not on file  Occupational History  . Not on file  Tobacco Use  . Smoking status: Never Smoker  . Smokeless tobacco: Never Used  Substance and Sexual Activity  . Alcohol use: No  . Drug use: No  . Sexual activity: Never  Other Topics Concern  . Not on file  Social History Narrative  . Not on file   Social Determinants of Health   Financial Resource Strain:   . Difficulty of Paying Living Expenses: Not on file  Food Insecurity:   . Worried About Charity fundraiser in the Last Year: Not on file  . Ran Out of Food in the Last Year: Not on file  Transportation Needs:   . Lack of Transportation (Medical): Not on file  . Lack of Transportation (Non-Medical): Not on file  Physical Activity:   . Days of Exercise per Week: Not on file  . Minutes of Exercise per Session: Not on file  Stress:   . Feeling of Stress : Not on file  Social Connections:   . Frequency of Communication with Friends and Family: Not on file  . Frequency of Social Gatherings with Friends and Family: Not on file   . Attends Religious Services: Not on file  . Active Member of Clubs or Organizations: Not on file  . Attends Archivist Meetings: Not on file  . Marital Status: Not on file  Intimate Partner Violence:   . Fear of Current or Ex-Partner: Not on file  . Emotionally Abused: Not on file  . Physically Abused: Not on file  . Sexually Abused: Not on file    Outpatient Medications Prior to Visit  Medication Sig Dispense Refill  . buPROPion (WELLBUTRIN XL) 150 MG 24 hr tablet Take 1 tablet (150 mg total) by mouth daily. 60 tablet 3  . busPIRone (BUSPAR) 10 MG tablet Take 1 tablet (10 mg total) by mouth 2 (two) times daily. 60 tablet 3  . hydrochlorothiazide (HYDRODIURIL) 25 MG tablet Take 1 tablet (25 mg total) by mouth daily. 30 tablet 3   No facility-administered medications prior to visit.    No Known Allergies  ROS Review of Systems  Genitourinary:       Vaginal discharge  All other systems reviewed and are negative.     Objective:    Physical Exam  Constitutional: He is oriented to person, place, and time. He appears well-developed and  well-nourished.  HENT:  Head: Normocephalic.  Eyes: Pupils are equal, round, and reactive to light. Right eye exhibits no discharge. Left eye exhibits no discharge.  Cardiovascular: Normal rate, regular rhythm and normal heart sounds.  Pulmonary/Chest: Effort normal and breath sounds normal.  Abdominal: Soft. Bowel sounds are normal.  Musculoskeletal:        General: Normal range of motion.     Cervical back: Normal range of motion.  Neurological: He is alert and oriented to person, place, and time. He has normal reflexes.  Skin: Skin is warm.  Psychiatric: He has a normal mood and affect. His behavior is normal. Judgment and thought content normal.    BP 123/63 (BP Location: Left Arm, Patient Position: Sitting, Cuff Size: Normal)   Pulse (!) 103   Temp (!) 97.1 F (36.2 C) (Temporal)   Ht 5\' 10"  (1.778 m)   Wt 178 lb 12.8 oz  (81.1 kg)   SpO2 99%   BMI 25.66 kg/m  Wt Readings from Last 3 Encounters:  06/08/19 178 lb 12.8 oz (81.1 kg)  04/27/19 178 lb 9.6 oz (81 kg)  08/18/18 180 lb 12.8 oz (82 kg) (81 %, Z= 0.88)*   * Growth percentiles are based on CDC (Boys, 2-20 Years) data.     There are no preventive care reminders to display for this patient.  There are no preventive care reminders to display for this patient.  Lab Results  Component Value Date   TSH 0.719 04/30/2018   Lab Results  Component Value Date   WBC 6.8 04/27/2019   HGB 15.1 04/27/2019   HCT 45.4 04/27/2019   MCV 86 04/27/2019   PLT 310 04/27/2019   Lab Results  Component Value Date   NA 141 04/27/2019   K 4.3 04/27/2019   CO2 25 04/27/2019   GLUCOSE 94 04/27/2019   BUN 14 04/27/2019   CREATININE 1.05 04/27/2019   BILITOT 0.4 04/27/2019   ALKPHOS 124 (H) 04/27/2019   AST 18 04/27/2019   ALT 22 04/27/2019   PROT 7.5 04/27/2019   ALBUMIN 4.9 04/27/2019   CALCIUM 9.9 04/27/2019   No results found for: CHOL No results found for: HDL No results found for: LDLCALC No results found for: TRIG No results found for: CHOLHDL No results found for: 04/29/2019    Assessment & Plan:   Marc Campbell was seen today for follow-up.  Diagnoses and all orders for this visit:  Screen for STD (sexually transmitted disease) -     Urine cytology ancillary only  Essential hypertension She will continue on low-sodium, DASH diet, medication compliance, 150 minutes of moderate intensity exercise per week. Discussed medication compliance, adverse effects. Well controled on HCTZ 25 mg  No orders of the defined types were placed in this encounter.   Follow-up: Return in about 3 months (around 09/06/2019) for tele Bp reading.    11/06/2019, NP

## 2019-07-06 ENCOUNTER — Telehealth (INDEPENDENT_AMBULATORY_CARE_PROVIDER_SITE_OTHER): Payer: Self-pay | Admitting: Primary Care

## 2019-09-10 ENCOUNTER — Other Ambulatory Visit (INDEPENDENT_AMBULATORY_CARE_PROVIDER_SITE_OTHER): Payer: Self-pay | Admitting: Primary Care

## 2019-12-11 ENCOUNTER — Other Ambulatory Visit (INDEPENDENT_AMBULATORY_CARE_PROVIDER_SITE_OTHER): Payer: Self-pay | Admitting: Family Medicine

## 2019-12-30 ENCOUNTER — Other Ambulatory Visit: Payer: Self-pay

## 2019-12-30 ENCOUNTER — Encounter (INDEPENDENT_AMBULATORY_CARE_PROVIDER_SITE_OTHER): Payer: Self-pay | Admitting: Primary Care

## 2019-12-30 ENCOUNTER — Ambulatory Visit (INDEPENDENT_AMBULATORY_CARE_PROVIDER_SITE_OTHER): Payer: Self-pay | Admitting: Primary Care

## 2019-12-30 VITALS — BP 145/67 | HR 74 | Temp 98.1°F | Ht 70.5 in | Wt 179.0 lb

## 2019-12-30 DIAGNOSIS — Z1159 Encounter for screening for other viral diseases: Secondary | ICD-10-CM

## 2019-12-30 DIAGNOSIS — I10 Essential (primary) hypertension: Secondary | ICD-10-CM

## 2019-12-30 DIAGNOSIS — Z0000 Encounter for general adult medical examination without abnormal findings: Secondary | ICD-10-CM

## 2019-12-30 MED ORDER — HYDROCHLOROTHIAZIDE 25 MG PO TABS: 25.0000 mg | ORAL_TABLET | Freq: Every day | ORAL | 1 refills | Status: DC

## 2019-12-30 NOTE — Patient Instructions (Signed)
   Managing Your Hypertension Hypertension is commonly called high blood pressure. This is when the force of your blood pressing against the walls of your arteries is too strong. Arteries are blood vessels that carry blood from your heart throughout your body. Hypertension forces the heart to work harder to pump blood, and may cause the arteries to become narrow or stiff. Having untreated or uncontrolled hypertension can cause heart attack, stroke, kidney disease, and other problems. What are blood pressure readings? A blood pressure reading consists of a higher number over a lower number. Ideally, your blood pressure should be below 120/80. The first ("top") number is called the systolic pressure. It is a measure of the pressure in your arteries as your heart beats. The second ("bottom") number is called the diastolic pressure. It is a measure of the pressure in your arteries as the heart relaxes. What does my blood pressure reading mean? Blood pressure is classified into four stages. Based on your blood pressure reading, your health care provider may use the following stages to determine what type of treatment you need, if any. Systolic pressure and diastolic pressure are measured in a unit called mm Hg. Normal  Systolic pressure: below 120.  Diastolic pressure: below 80. Elevated  Systolic pressure: 120-129.  Diastolic pressure: below 80. Hypertension stage 1  Systolic pressure: 130-139.  Diastolic pressure: 80-89. Hypertension stage 2  Systolic pressure: 140 or above.  Diastolic pressure: 90 or above. What health risks are associated with hypertension? Managing your hypertension is an important responsibility. Uncontrolled hypertension can lead to:  A heart attack.  A stroke.  A weakened blood vessel (aneurysm).  Heart failure.  Kidney damage.  Eye damage.  Metabolic syndrome.  Memory and concentration problems. What changes can I make to manage my  hypertension? Hypertension can be managed by making lifestyle changes and possibly by taking medicines. Your health care provider will help you make a plan to bring your blood pressure within a normal range. Eating and drinking   Eat a diet that is high in fiber and potassium, and low in salt (sodium), added sugar, and fat. An example eating plan is called the DASH (Dietary Approaches to Stop Hypertension) diet. To eat this way: ? Eat plenty of fresh fruits and vegetables. Try to fill half of your plate at each meal with fruits and vegetables. ? Eat whole grains, such as whole wheat pasta, brown rice, or whole grain bread. Fill about one quarter of your plate with whole grains. ? Eat low-fat diary products. ? Avoid fatty cuts of meat, processed or cured meats, and poultry with skin. Fill about one quarter of your plate with lean proteins such as fish, chicken without skin, beans, eggs, and tofu. ? Avoid premade and processed foods. These tend to be higher in sodium, added sugar, and fat.  Reduce your daily sodium intake. Most people with hypertension should eat less than 1,500 mg of sodium a day.  Limit alcohol intake to no more than 1 drink a day for nonpregnant women and 2 drinks a day for men. One drink equals 12 oz of beer, 5 oz of wine, or 1 oz of hard liquor. Lifestyle  Work with your health care provider to maintain a healthy body weight, or to lose weight. Ask what an ideal weight is for you.  Get at least 30 minutes of exercise that causes your heart to beat faster (aerobic exercise) most days of the week. Activities may include walking, swimming, or biking.    Include exercise to strengthen your muscles (resistance exercise), such as weight lifting, as part of your weekly exercise routine. Try to do these types of exercises for 30 minutes at least 3 days a week.  Do not use any products that contain nicotine or tobacco, such as cigarettes and e-cigarettes. If you need help quitting,  ask your health care provider.  Control any long-term (chronic) conditions you have, such as high cholesterol or diabetes. Monitoring  Monitor your blood pressure at home as told by your health care provider. Your personal target blood pressure may vary depending on your medical conditions, your age, and other factors.  Have your blood pressure checked regularly, as often as told by your health care provider. Working with your health care provider  Review all the medicines you take with your health care provider because there may be side effects or interactions.  Talk with your health care provider about your diet, exercise habits, and other lifestyle factors that may be contributing to hypertension.  Visit your health care provider regularly. Your health care provider can help you create and adjust your plan for managing hypertension. Will I need medicine to control my blood pressure? Your health care provider may prescribe medicine if lifestyle changes are not enough to get your blood pressure under control, and if:  Your systolic blood pressure is 130 or higher.  Your diastolic blood pressure is 80 or higher. Take medicines only as told by your health care provider. Follow the directions carefully. Blood pressure medicines must be taken as prescribed. The medicine does not work as well when you skip doses. Skipping doses also puts you at risk for problems. Contact a health care provider if:  You think you are having a reaction to medicines you have taken.  You have repeated (recurrent) headaches.  You feel dizzy.  You have swelling in your ankles.  You have trouble with your vision. Get help right away if:  You develop a severe headache or confusion.  You have unusual weakness or numbness, or you feel faint.  You have severe pain in your chest or abdomen.  You vomit repeatedly.  You have trouble breathing. Summary  Hypertension is when the force of blood pumping  through your arteries is too strong. If this condition is not controlled, it may put you at risk for serious complications.  Your personal target blood pressure may vary depending on your medical conditions, your age, and other factors. For most people, a normal blood pressure is less than 120/80.  Hypertension is managed by lifestyle changes, medicines, or both. Lifestyle changes include weight loss, eating a healthy, low-sodium diet, exercising more, and limiting alcohol. This information is not intended to replace advice given to you by your health care provider. Make sure you discuss any questions you have with your health care provider. Document Revised: 10/09/2018 Document Reviewed: 05/15/2016 Elsevier Patient Education  2020 [employer]  
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
## 2019-12-31 LAB — CBC WITH DIFFERENTIAL/PLATELET
Basophils Absolute: 0.1 10*3/uL (ref 0.0–0.2)
Basos: 1 %
EOS (ABSOLUTE): 0.1 10*3/uL (ref 0.0–0.4)
Eos: 1 %
Hematocrit: 43.3 % (ref 37.5–51.0)
Hemoglobin: 14.6 g/dL (ref 13.0–17.7)
Immature Grans (Abs): 0 10*3/uL (ref 0.0–0.1)
Immature Granulocytes: 0 %
Lymphocytes Absolute: 2.4 10*3/uL (ref 0.7–3.1)
Lymphs: 41 %
MCH: 28.7 pg (ref 26.6–33.0)
MCHC: 33.7 g/dL (ref 31.5–35.7)
MCV: 85 fL (ref 79–97)
Monocytes Absolute: 0.5 10*3/uL (ref 0.1–0.9)
Monocytes: 8 %
Neutrophils Absolute: 2.8 10*3/uL (ref 1.4–7.0)
Neutrophils: 49 %
Platelets: 255 10*3/uL (ref 150–450)
RBC: 5.08 x10E6/uL (ref 4.14–5.80)
RDW: 12.6 % (ref 11.6–15.4)
WBC: 5.8 10*3/uL (ref 3.4–10.8)

## 2019-12-31 LAB — HEPATITIS C ANTIBODY: Hep C Virus Ab: <0.1 s/co ratio (ref 0.0–0.9)

## 2020-03-31 ENCOUNTER — Encounter (INDEPENDENT_AMBULATORY_CARE_PROVIDER_SITE_OTHER): Payer: Self-pay | Admitting: Primary Care

## 2020-03-31 ENCOUNTER — Other Ambulatory Visit: Payer: Self-pay

## 2020-03-31 ENCOUNTER — Ambulatory Visit (INDEPENDENT_AMBULATORY_CARE_PROVIDER_SITE_OTHER): Payer: Self-pay | Admitting: Primary Care

## 2020-03-31 DIAGNOSIS — I10 Essential (primary) hypertension: Secondary | ICD-10-CM

## 2020-03-31 NOTE — Progress Notes (Signed)
°  [facility]   [name] a 19 year old male  presents for  hypertension evaluation,previous visit medication was adjusted to include hydrochlorothiazide 25 mg.  Blood pressure remains elevated.  After we had a lengthy discussion about risk of elevated blood pressure and his young age he admitted to not taking his medication on a regular basis" sometimes".  After this conversation he asked his to be scheduled in 1 month and he will take his medications on a daily basis.  Very pleased with his decision  Patient denies adherence with medications.  Current Medication List Current Outpatient Medications on File Prior to Visit  Medication Sig Dispense Refill   hydrochlorothiazide (HYDRODIURIL) 25 MG tablet Take 1 tablet (25 mg total) by mouth daily. 90 tablet 1   No current facility-administered medications on file prior to visit.   Past Medical History  No past medical history on file.  Dietary habits include: loves Choptoles stop sodas Exercise habits include:volley ball daily  Family / Social history: no ASCVD risk factors include- [place]  O:  Physical Exam Vitals reviewed.  HENT:     Head: Normocephalic.     Nose: Nose normal.  Eyes:     Extraocular Movements: Extraocular movements intact.  Cardiovascular:     Rate and Rhythm: Normal rate and regular rhythm.  Pulmonary:     Effort: Pulmonary effort is normal.     Breath sounds: Normal breath sounds.  Abdominal:     General: Bowel sounds are normal.     Palpations: Abdomen is soft.  Musculoskeletal:        General: Normal range of motion.     Cervical back: Normal range of motion and neck supple.  Skin:    General: Skin is warm and dry.  Neurological:     Mental Status: He is oriented to person, place, and time.  Psychiatric:        Mood and Affect: Mood normal.        Behavior: Behavior normal.        Thought Content: Thought content normal.        Judgment: Judgment normal.       ROS  Last 3 Office BP readings: BP Readings from Last 3 Encounters:  03/31/20 (!) 147/73  12/30/19 (!) 145/67  06/14/19 (!) 149/73    BMET    Component Value Date/Time   NA 141 04/27/2019 1055   K 4.3 04/27/2019 1055   CL 102 04/27/2019 1055   CO2 25 04/27/2019 1055   GLUCOSE 94 04/27/2019 1055   BUN 14 04/27/2019 1055   CREATININE 1.05 04/27/2019 1055   CALCIUM 9.9 04/27/2019 1055   GFRNONAA 102 04/27/2019 1055   GFRAA 118 04/27/2019 1055    Renal function: CrCl cannot be calculated (Patient's most recent lab result is older than the maximum 21 days allowed.).  Clinical ASCVD: No  The ASCVD Risk score Denman George DC Jr., et al., 2013) failed to calculate for the following reasons:   The 2013 ASCVD risk score is only valid for ages 70 to 68   A/P: Essential hypertension Hypertension longstanding diagnosed currently 130/80  on current medications. BP Goal = 130/80 mmHg. Patient is not adherent with current medications.  -Continued  -F/u labs ordered - no -Counseled on lifestyle modifications for blood pressure control including reduced dietary sodium, increased exercise, adequate sleep  Marc Campbell

## 2020-03-31 NOTE — Patient Instructions (Signed)
   Managing Your Hypertension Hypertension is commonly called high blood pressure. This is when the force of your blood pressing against the walls of your arteries is too strong. Arteries are blood vessels that carry blood from your heart throughout your body. Hypertension forces the heart to work harder to pump blood, and may cause the arteries to become narrow or stiff. Having untreated or uncontrolled hypertension can cause heart attack, stroke, kidney disease, and other problems. What are blood pressure readings? A blood pressure reading consists of a higher number over a lower number. Ideally, your blood pressure should be below 120/80. The first ("top") number is called the systolic pressure. It is a measure of the pressure in your arteries as your heart beats. The second ("bottom") number is called the diastolic pressure. It is a measure of the pressure in your arteries as the heart relaxes. What does my blood pressure reading mean? Blood pressure is classified into four stages. Based on your blood pressure reading, your health care provider may use the following stages to determine what type of treatment you need, if any. Systolic pressure and diastolic pressure are measured in a unit called mm Hg. Normal  Systolic pressure: below 120.  Diastolic pressure: below 80. Elevated  Systolic pressure: 120-129.  Diastolic pressure: below 80. Hypertension stage 1  Systolic pressure: 130-139.  Diastolic pressure: 80-89. Hypertension stage 2  Systolic pressure: 140 or above.  Diastolic pressure: 90 or above. What health risks are associated with hypertension? Managing your hypertension is an important responsibility. Uncontrolled hypertension can lead to:  A heart attack.  A stroke.  A weakened blood vessel (aneurysm).  Heart failure.  Kidney damage.  Eye damage.  Metabolic syndrome.  Memory and concentration problems. What changes can I make to manage my  hypertension? Hypertension can be managed by making lifestyle changes and possibly by taking medicines. Your health care provider will help you make a plan to bring your blood pressure within a normal range. Eating and drinking   Eat a diet that is high in fiber and potassium, and low in salt (sodium), added sugar, and fat. An example eating plan is called the DASH (Dietary Approaches to Stop Hypertension) diet. To eat this way: ? Eat plenty of fresh fruits and vegetables. Try to fill half of your plate at each meal with fruits and vegetables. ? Eat whole grains, such as whole wheat pasta, brown rice, or whole grain bread. Fill about one quarter of your plate with whole grains. ? Eat low-fat diary products. ? Avoid fatty cuts of meat, processed or cured meats, and poultry with skin. Fill about one quarter of your plate with lean proteins such as fish, chicken without skin, beans, eggs, and tofu. ? Avoid premade and processed foods. These tend to be higher in sodium, added sugar, and fat.  Reduce your daily sodium intake. Most people with hypertension should eat less than 1,500 mg of sodium a day.  Limit alcohol intake to no more than 1 drink a day for nonpregnant women and 2 drinks a day for men. One drink equals 12 oz of beer, 5 oz of wine, or 1 oz of hard liquor. Lifestyle  Work with your health care provider to maintain a healthy body weight, or to lose weight. Ask what an ideal weight is for you.  Get at least 30 minutes of exercise that causes your heart to beat faster (aerobic exercise) most days of the week. Activities may include walking, swimming, or biking.    Include exercise to strengthen your muscles (resistance exercise), such as weight lifting, as part of your weekly exercise routine. Try to do these types of exercises for 30 minutes at least 3 days a week.  Do not use any products that contain nicotine or tobacco, such as cigarettes and e-cigarettes. If you need help quitting,  ask your health care provider.  Control any long-term (chronic) conditions you have, such as high cholesterol or diabetes. Monitoring  Monitor your blood pressure at home as told by your health care provider. Your personal target blood pressure may vary depending on your medical conditions, your age, and other factors.  Have your blood pressure checked regularly, as often as told by your health care provider. Working with your health care provider  Review all the medicines you take with your health care provider because there may be side effects or interactions.  Talk with your health care provider about your diet, exercise habits, and other lifestyle factors that may be contributing to hypertension.  Visit your health care provider regularly. Your health care provider can help you create and adjust your plan for managing hypertension. Will I need medicine to control my blood pressure? Your health care provider may prescribe medicine if lifestyle changes are not enough to get your blood pressure under control, and if:  Your systolic blood pressure is 130 or higher.  Your diastolic blood pressure is 80 or higher. Take medicines only as told by your health care provider. Follow the directions carefully. Blood pressure medicines must be taken as prescribed. The medicine does not work as well when you skip doses. Skipping doses also puts you at risk for problems. Contact a health care provider if:  You think you are having a reaction to medicines you have taken.  You have repeated (recurrent) headaches.  You feel dizzy.  You have swelling in your ankles.  You have trouble with your vision. Get help right away if:  You develop a severe headache or confusion.  You have unusual weakness or numbness, or you feel faint.  You have severe pain in your chest or abdomen.  You vomit repeatedly.  You have trouble breathing. Summary  Hypertension is when the force of blood pumping  through your arteries is too strong. If this condition is not controlled, it may put you at risk for serious complications.  Your personal target blood pressure may vary depending on your medical conditions, your age, and other factors. For most people, a normal blood pressure is less than 120/80.  Hypertension is managed by lifestyle changes, medicines, or both. Lifestyle changes include weight loss, eating a healthy, low-sodium diet, exercising more, and limiting alcohol. This information is not intended to replace advice given to you by your health care provider. Make sure you discuss any questions you have with your health care provider. Document Revised: 10/09/2018 Document Reviewed: 05/15/2016 Elsevier Patient Education  2020 [employer]  

## 2020-04-01 LAB — CMP14+EGFR
ALT: 21 IU/L (ref 0–44)
AST: 19 IU/L (ref 0–40)
Albumin/Globulin Ratio: 1.8 (ref 1.2–2.2)
Albumin: 4.5 g/dL (ref 4.1–5.2)
Alkaline Phosphatase: 89 IU/L (ref 44–121)
BUN/Creatinine Ratio: 14 (ref 9–20)
BUN: 15 mg/dL (ref 6–20)
Bilirubin Total: 0.7 mg/dL (ref 0.0–1.2)
CO2: 27 mmol/L (ref 20–29)
Calcium: 9.7 mg/dL (ref 8.7–10.2)
Chloride: 99 mmol/L (ref 96–106)
Creatinine, Ser: 1.1 mg/dL (ref 0.76–1.27)
GFR calc Af Amer: 110 mL/min/{1.73_m2} (ref >59)
GFR calc non Af Amer: 95 mL/min/{1.73_m2} (ref >59)
Globulin, Total: 2.5 g/dL (ref 1.5–4.5)
Glucose: 82 mg/dL (ref 65–99)
Potassium: 3.9 mmol/L (ref 3.5–5.2)
Sodium: 138 mmol/L (ref 134–144)
Total Protein: 7 g/dL (ref 6.0–8.5)

## 2020-04-28 ENCOUNTER — Ambulatory Visit (INDEPENDENT_AMBULATORY_CARE_PROVIDER_SITE_OTHER): Payer: Self-pay | Admitting: Primary Care

## 2020-04-28 ENCOUNTER — Encounter (INDEPENDENT_AMBULATORY_CARE_PROVIDER_SITE_OTHER): Payer: Self-pay | Admitting: Primary Care

## 2020-04-28 ENCOUNTER — Other Ambulatory Visit: Payer: Self-pay

## 2020-04-28 VITALS — BP 146/77 | HR 62 | Temp 97.3°F | Ht 70.0 in | Wt 172.6 lb

## 2020-04-28 DIAGNOSIS — I10 Essential (primary) hypertension: Secondary | ICD-10-CM

## 2020-04-28 MED ORDER — AMLODIPINE BESYLATE 10 MG PO TABS: 10.0000 mg | ORAL_TABLET | Freq: Every day | ORAL | 3 refills | Status: DC

## 2020-04-28 NOTE — Patient Instructions (Signed)
   Managing Your Hypertension Hypertension is commonly called high blood pressure. This is when the force of your blood pressing against the walls of your arteries is too strong. Arteries are blood vessels that carry blood from your heart throughout your body. Hypertension forces the heart to work harder to pump blood, and may cause the arteries to become narrow or stiff. Having untreated or uncontrolled hypertension can cause heart attack, stroke, kidney disease, and other problems. What are blood pressure readings? A blood pressure reading consists of a higher number over a lower number. Ideally, your blood pressure should be below 120/80. The first ("top") number is called the systolic pressure. It is a measure of the pressure in your arteries as your heart beats. The second ("bottom") number is called the diastolic pressure. It is a measure of the pressure in your arteries as the heart relaxes. What does my blood pressure reading mean? Blood pressure is classified into four stages. Based on your blood pressure reading, your health care provider may use the following stages to determine what type of treatment you need, if any. Systolic pressure and diastolic pressure are measured in a unit called mm Hg. Normal  Systolic pressure: below 120.  Diastolic pressure: below 80. Elevated  Systolic pressure: 120-129.  Diastolic pressure: below 80. Hypertension stage 1  Systolic pressure: 130-139.  Diastolic pressure: 80-89. Hypertension stage 2  Systolic pressure: 140 or above.  Diastolic pressure: 90 or above. What health risks are associated with hypertension? Managing your hypertension is an important responsibility. Uncontrolled hypertension can lead to:  A heart attack.  A stroke.  A weakened blood vessel (aneurysm).  Heart failure.  Kidney damage.  Eye damage.  Metabolic syndrome.  Memory and concentration problems. What changes can I make to manage my  hypertension? Hypertension can be managed by making lifestyle changes and possibly by taking medicines. Your health care provider will help you make a plan to bring your blood pressure within a normal range. Eating and drinking   Eat a diet that is high in fiber and potassium, and low in salt (sodium), added sugar, and fat. An example eating plan is called the DASH (Dietary Approaches to Stop Hypertension) diet. To eat this way: ? Eat plenty of fresh fruits and vegetables. Try to fill half of your plate at each meal with fruits and vegetables. ? Eat whole grains, such as whole wheat pasta, brown rice, or whole grain bread. Fill about one quarter of your plate with whole grains. ? Eat low-fat diary products. ? Avoid fatty cuts of meat, processed or cured meats, and poultry with skin. Fill about one quarter of your plate with lean proteins such as fish, chicken without skin, beans, eggs, and tofu. ? Avoid premade and processed foods. These tend to be higher in sodium, added sugar, and fat.  Reduce your daily sodium intake. Most people with hypertension should eat less than 1,500 mg of sodium a day.  Limit alcohol intake to no more than 1 drink a day for nonpregnant women and 2 drinks a day for men. One drink equals 12 oz of beer, 5 oz of wine, or 1 oz of hard liquor. Lifestyle  Work with your health care provider to maintain a healthy body weight, or to lose weight. Ask what an ideal weight is for you.  Get at least 30 minutes of exercise that causes your heart to beat faster (aerobic exercise) most days of the week. Activities may include walking, swimming, or biking.    Include exercise to strengthen your muscles (resistance exercise), such as weight lifting, as part of your weekly exercise routine. Try to do these types of exercises for 30 minutes at least 3 days a week.  Do not use any products that contain nicotine or tobacco, such as cigarettes and e-cigarettes. If you need help quitting,  ask your health care provider.  Control any long-term (chronic) conditions you have, such as high cholesterol or diabetes. Monitoring  Monitor your blood pressure at home as told by your health care provider. Your personal target blood pressure may vary depending on your medical conditions, your age, and other factors.  Have your blood pressure checked regularly, as often as told by your health care provider. Working with your health care provider  Review all the medicines you take with your health care provider because there may be side effects or interactions.  Talk with your health care provider about your diet, exercise habits, and other lifestyle factors that may be contributing to hypertension.  Visit your health care provider regularly. Your health care provider can help you create and adjust your plan for managing hypertension. Will I need medicine to control my blood pressure? Your health care provider may prescribe medicine if lifestyle changes are not enough to get your blood pressure under control, and if:  Your systolic blood pressure is 130 or higher.  Your diastolic blood pressure is 80 or higher. Take medicines only as told by your health care provider. Follow the directions carefully. Blood pressure medicines must be taken as prescribed. The medicine does not work as well when you skip doses. Skipping doses also puts you at risk for problems. Contact a health care provider if:  You think you are having a reaction to medicines you have taken.  You have repeated (recurrent) headaches.  You feel dizzy.  You have swelling in your ankles.  You have trouble with your vision. Get help right away if:  You develop a severe headache or confusion.  You have unusual weakness or numbness, or you feel faint.  You have severe pain in your chest or abdomen.  You vomit repeatedly.  You have trouble breathing. Summary  Hypertension is when the force of blood pumping  through your arteries is too strong. If this condition is not controlled, it may put you at risk for serious complications.  Your personal target blood pressure may vary depending on your medical conditions, your age, and other factors. For most people, a normal blood pressure is less than 120/80.  Hypertension is managed by lifestyle changes, medicines, or both. Lifestyle changes include weight loss, eating a healthy, low-sodium diet, exercising more, and limiting alcohol. This information is not intended to replace advice given to you by your health care provider. Make sure you discuss any questions you have with your health care provider. Document Revised: 10/09/2018 Document Reviewed: 05/15/2016 Elsevier Patient Education  2020 [employer]  

## 2020-04-28 NOTE — Progress Notes (Signed)
  [facility]    Presents for  hypertension evaluation, on previous visit medication was adjusted to include HCTZ 25mg    Patient reports adherence with medications.  Current Medication List Current Outpatient Medications on File Prior to Visit  Medication Sig Dispense Refill  . hydrochlorothiazide (HYDRODIURIL) 25 MG tablet Take 1 tablet (25 mg total) by mouth daily. 90 tablet 1   No current facility-administered medications on file prior to visit.   Past Medical History  History reviewed. No pertinent past medical history. Dietary habits include: monitor sodium intake  Exercise habits include:walking  Family / Social history: none father and mother healthy  ASCVD risk factors include-  O:  Physical Exam Vitals reviewed.  Constitutional:      Appearance: Normal appearance. He is normal weight.  HENT:     Head: Normocephalic.     Nose: Nose normal.  Cardiovascular:     Rate and Rhythm: Normal rate and regular rhythm.  Pulmonary:     Effort: Pulmonary effort is normal.     Breath sounds: Normal breath sounds.  Abdominal:     General: Bowel sounds are normal.     Palpations: Abdomen is soft.  Musculoskeletal:        General: Normal range of motion.     Cervical back: Normal range of motion.  Skin:    General: Skin is warm and dry.  Neurological:     Mental Status: He is alert and oriented to person, place, and time.  Psychiatric:        Mood and Affect: Mood normal.        Behavior: Behavior normal.        Thought Content: Thought content normal.        Judgment: Judgment normal.      Review of Systems  All other systems reviewed and are negative.   Last 3 Office BP readings: BP Readings from Last 3 Encounters:  04/28/20 (!) 146/77  03/31/20 (!) 147/73  12/30/19 (!) 145/67    BMET    Component Value Date/Time   NA 138 03/31/2020 1118   K 3.9 03/31/2020 1118   CL 99 03/31/2020 1118   CO2 27 03/31/2020 1118   GLUCOSE 82  03/31/2020 1118   BUN 15 03/31/2020 1118   CREATININE 1.10 03/31/2020 1118   CALCIUM 9.7 03/31/2020 1118   GFRNONAA 95 03/31/2020 1118   GFRAA 110 03/31/2020 1118    Renal function: CrCl cannot be calculated (Patient's most recent lab result is older than the maximum 21 days allowed.).  Clinical ASCVD: No  The ASCVD Risk score 05/31/2020 DC Jr., et al., 2013) failed to calculate for the following reasons:   The 2013 ASCVD risk score is only valid for ages 21 to 46   A/P: Hypertension longstanding currently HCTZ 25mg  on current medications. BP Goal = 130/80 mmHg. Patient is adherent with current medications.  -Started amlodipine 10mg  daily and continue HCTZ 25mg  .  -F/u labs ordered - none  -Counseled on lifestyle modifications for blood pressure control including reduced dietary sodium, increased exercise, adequate sleep  76

## 2020-06-09 ENCOUNTER — Other Ambulatory Visit: Payer: Self-pay

## 2020-06-09 ENCOUNTER — Encounter (INDEPENDENT_AMBULATORY_CARE_PROVIDER_SITE_OTHER): Payer: Self-pay | Admitting: Primary Care

## 2020-06-09 ENCOUNTER — Ambulatory Visit (INDEPENDENT_AMBULATORY_CARE_PROVIDER_SITE_OTHER): Payer: Self-pay | Admitting: Primary Care

## 2020-06-09 VITALS — BP 133/77 | HR 79 | Temp 97.3°F | Ht 70.0 in | Wt 170.2 lb

## 2020-06-09 DIAGNOSIS — Z0130 Encounter for examination of blood pressure without abnormal findings: Secondary | ICD-10-CM

## 2020-06-09 NOTE — Progress Notes (Signed)
Established Patient Office Visit  Subjective:  Patient ID: Marc Campbell, male    DOB: 10/27/2000  Age: 19 y.o. MRN: [id number]  CC:  Chief Complaint  Patient presents with  . Blood Pressure Check    HPI Marc Campbell is a 19 year old male who presents for blood pressure follow up. Did not take medication prescribed changed diet and excerning routinely   No past medical history on file.  No past surgical history on file.  Family History  Problem Relation Age of Onset  . Healthy Mother   . Healthy Father   . Anxiety disorder Brother   . Cancer Neg Hx   . Heart disease Neg Hx   . Stroke Neg Hx     Social History   Socioeconomic History  . Marital status: Single    Spouse name: Not on file  . Number of children: Not on file  . Years of education: Not on file  . Highest education level: Not on file  Occupational History  . Not on file  Tobacco Use  . Smoking status: Never Smoker  . Smokeless tobacco: Never Used  Vaping Use  . Vaping Use: Never used  Substance and Sexual Activity  . Alcohol use: No  . Drug use: No  . Sexual activity: Never  Other Topics Concern  . Not on file  Social History Narrative  . Not on file   Social Determinants of Health   Financial Resource Strain: Not on file  Food Insecurity: Not on file  Transportation Needs: Not on file  Physical Activity: Not on file  Stress: Not on file  Social Connections: Not on file  Intimate Partner Violence: Not on file    Outpatient Medications Prior to Visit  Medication Sig Dispense Refill  . amLODipine (NORVASC) 10 MG tablet Take 1 tablet (10 mg total) by mouth daily. 90 tablet 3  . hydrochlorothiazide (HYDRODIURIL) 25 MG tablet Take 1 tablet (25 mg total) by mouth daily. 90 tablet 1   No facility-administered medications prior to visit.    No Known Allergies  ROS Review of Systems  All other systems reviewed and are negative.     Objective:    Physical Exam Vitals  reviewed.  Constitutional:      Appearance: Normal appearance. He is normal weight.  HENT:     Head: Normocephalic.  Cardiovascular:     Rate and Rhythm: Normal rate and regular rhythm.  Pulmonary:     Effort: Pulmonary effort is normal.     Breath sounds: Normal breath sounds.  Abdominal:     General: Abdomen is flat. Bowel sounds are normal.     Palpations: Abdomen is soft.  Musculoskeletal:     Cervical back: Normal range of motion.  Skin:    General: Skin is warm and dry.  Neurological:     Mental Status: He is alert and oriented to person, place, and time.  Psychiatric:        Mood and Affect: Mood normal.        Behavior: Behavior normal.        Thought Content: Thought content normal.        Judgment: Judgment normal.     BP 133/77 (BP Location: Right Arm, Patient Position: Sitting, Cuff Size: Normal)   Pulse 79   Temp (!) 97.3 F (36.3 C) (Temporal)   Ht 5\' 10"  (1.778 m)   Wt 170 lb 3.2 oz (77.2 kg)   SpO2 98%  BMI 24.42 kg/m  Wt Readings from Last 3 Encounters:  06/09/20 170 lb 3.2 oz (77.2 kg)  04/28/20 172 lb 9.6 oz (78.3 kg)  03/31/20 176 lb 9.6 oz (80.1 kg)     Health Maintenance Due  Topic Date Due  . COVID-19 Vaccine (1) Never done    There are no preventive care reminders to display for this patient.  Lab Results  Component Value Date   TSH 0.719 04/30/2018   Lab Results  Component Value Date   WBC 5.8 12/30/2019   HGB 14.6 12/30/2019   HCT 43.3 12/30/2019   MCV 85 12/30/2019   PLT 255 12/30/2019   Lab Results  Component Value Date   NA 138 03/31/2020   K 3.9 03/31/2020   CO2 27 03/31/2020   GLUCOSE 82 03/31/2020   BUN 15 03/31/2020   CREATININE 1.10 03/31/2020   BILITOT 0.7 03/31/2020   ALKPHOS 89 03/31/2020   AST 19 03/31/2020   ALT 21 03/31/2020   PROT 7.0 03/31/2020   ALBUMIN 4.5 03/31/2020   CALCIUM 9.7 03/31/2020   Assessment & Plan:  Marc Campbell was seen today for blood pressure check.  Diagnoses and all orders for this  visit:  Blood pressure check Bp today 133/77 she would like to continuing managing her hypertension with diet and exercise   No orders of the defined types were placed in this encounter.   Follow-up: No follow-ups on file.    Grayce Sessions, NP

## 2020-06-24 ENCOUNTER — Other Ambulatory Visit (INDEPENDENT_AMBULATORY_CARE_PROVIDER_SITE_OTHER): Payer: Self-pay | Admitting: Primary Care

## 2020-06-24 DIAGNOSIS — I10 Essential (primary) hypertension: Secondary | ICD-10-CM

## 2020-06-25 NOTE — Telephone Encounter (Signed)
Requested Prescriptions  Pending Prescriptions Disp Refills   hydrochlorothiazide (HYDRODIURIL) 25 MG tablet [Pharmacy Med Name: HYDROCHLOROTHIAZIDE 25MG  TABLETS] 90 tablet 1    Sig: TAKE 1 TABLET(25 MG) BY MOUTH DAILY     Cardiovascular: Diuretics - Thiazide Passed - 06/24/2020  3:07 AM      Passed - Ca in normal range and within 360 days    Calcium  Date Value Ref Range Status  03/31/2020 9.7 8.7 - 10.2 mg/dL Final         Passed - Cr in normal range and within 360 days    Creatinine, Ser  Date Value Ref Range Status  03/31/2020 1.10 0.76 - 1.27 mg/dL Final         Passed - K in normal range and within 360 days    Potassium  Date Value Ref Range Status  03/31/2020 3.9 3.5 - 5.2 mmol/L Final         Passed - Na in normal range and within 360 days    Sodium  Date Value Ref Range Status  03/31/2020 138 134 - 144 mmol/L Final         Passed - Last BP in normal range    BP Readings from Last 1 Encounters:  06/09/20 133/77         Passed - Valid encounter within last 6 months    Recent Outpatient Visits          2 weeks ago Blood pressure check   [facility] 14/10/21, NP   1 month ago Essential hypertension   [facility], NP   2 months ago Essential hypertension   [facility], NP   5 months ago Essential hypertension   [facility], NP   1 year ago Screen for STD (sexually transmitted disease)   [facility], NP      Future Appointments            In 1 week Grayce Sessions Randa Evens, NP [facility]

## 2020-07-07 ENCOUNTER — Telehealth (INDEPENDENT_AMBULATORY_CARE_PROVIDER_SITE_OTHER): Payer: Self-pay | Admitting: Primary Care

## 2020-07-07 ENCOUNTER — Other Ambulatory Visit: Payer: Self-pay

## 2020-07-07 ENCOUNTER — Encounter (INDEPENDENT_AMBULATORY_CARE_PROVIDER_SITE_OTHER): Payer: Self-pay | Admitting: Primary Care

## 2020-07-07 ENCOUNTER — Telehealth (INDEPENDENT_AMBULATORY_CARE_PROVIDER_SITE_OTHER): Payer: HRSA Program | Admitting: Primary Care

## 2020-07-07 VITALS — BP 129/71 | HR 68

## 2020-07-07 DIAGNOSIS — F4323 Adjustment disorder with mixed anxiety and depressed mood: Secondary | ICD-10-CM | POA: Diagnosis not present

## 2020-07-07 DIAGNOSIS — I10 Essential (primary) hypertension: Secondary | ICD-10-CM

## 2020-07-07 DIAGNOSIS — U071 COVID-19: Secondary | ICD-10-CM

## 2020-07-18 NOTE — Progress Notes (Signed)
Virtual Visit via Telephone Note  I connected with Marc Campbell on 07/18/20 at 11:10 AM EST by telephone and verified that I am speaking with the correct person using two identifiers.  Location: Patient: present to office  Provider: Grayce Sessions working from home   I discussed the limitations, risks, security and privacy concerns of performing an evaluation and management service by telephone and the availability of in person appointments. I also discussed with the patient that there may be a patient responsible charge related to this service. The patient expressed understanding and agreed to proceed.   History of Present Illness: Marc Campbell is a 20 year old Hispanic male presented for follow depression. He is doing well,he has found  Music as a coping mechanism . He has his pain , disappointment , breakup with his girl friend to songs and putting them on the Internet. Bp was 129/71 improving on dual therapy amlodipine 10 and HCTZ 25 . Taking medication daily. Denies shortness of breath, headaches, chest pain or lower extremity edema. He was at the office so we could see how happy he was and Bp check PCP at home.  Discuss wanting him to take COVID vaccine since he is thinking about trying to do tours. He will think about. No past medical history on file.  Current Outpatient Medications on File Prior to Visit  Medication Sig Dispense Refill  . amLODipine (NORVASC) 10 MG tablet Take 1 tablet (10 mg total) by mouth daily. 90 tablet 3  . hydrochlorothiazide (HYDRODIURIL) 25 MG tablet TAKE 1 TABLET(25 MG) BY MOUTH DAILY 90 tablet 0   No current facility-administered medications on file prior to visit.    Observations/Objective: BP 129/71 (BP Location: Right Arm, Patient Position: Sitting, Cuff Size: Normal)   Pulse 68   SpO2 98%   Review of Systems  All other systems reviewed and are negative.  Assessment and Plan: Marc Campbell was seen today for follow-up.  Diagnoses and all  orders for this visit:  Essential hypertension Blood pressure is at goal on dual therapy . Encourage to watch fast foods and monitor ,sodium, DASH diet, medication compliance, 150 minutes of moderate intensity exercise per week. Discussed medication compliance, adverse effects.  Adjustment disorder with mixed anxiety and depressed mood Resolved Excited happy continue self therapy writing and singing   COVID Not vaccined discussed risk factors, social distancing , wearing a mask any symptoms of fever, chills, body aches, headache feeling different get screened    Follow Up Instructions:    I discussed the assessment and treatment plan with the patient. The patient was provided an opportunity to ask questions and all were answered. The patient agreed with the plan and demonstrated an understanding of the instructions.   The patient was advised to call back or seek an in-person evaluation if the symptoms worsen or if the condition fails to improve as anticipated.  I provided 22 minutes of non-face-to-face time during this encounter.   Grayce Sessions, NP

## 2020-07-20 DIAGNOSIS — F4323 Adjustment disorder with mixed anxiety and depressed mood: Secondary | ICD-10-CM | POA: Insufficient documentation

## 2020-07-20 DIAGNOSIS — I10 Essential (primary) hypertension: Secondary | ICD-10-CM | POA: Insufficient documentation

## 2020-07-20 MED ORDER — HYDROCHLOROTHIAZIDE 25 MG PO TABS: ORAL_TABLET | ORAL | 2 refills | Status: DC

## 2021-04-24 ENCOUNTER — Other Ambulatory Visit (INDEPENDENT_AMBULATORY_CARE_PROVIDER_SITE_OTHER): Payer: Self-pay | Admitting: Primary Care

## 2021-04-24 DIAGNOSIS — I10 Essential (primary) hypertension: Secondary | ICD-10-CM

## 2021-04-24 MED ORDER — AMLODIPINE BESYLATE 10 MG PO TABS: 10.0000 mg | ORAL_TABLET | Freq: Every day | ORAL | 0 refills | Status: DC

## 2021-04-24 NOTE — Telephone Encounter (Signed)
Requested medications are due for refill today yes  Requested medications are on the active medication list yes  Last refill 01/19/21  Last visit 07/07/20  Future visit scheduled no  Notes to clinic .failed protocol of visit within 6 months, no upcoming appt scheduled, please assess.  Requested Prescriptions  Pending Prescriptions Disp Refills   amLODipine (NORVASC) 10 MG tablet [Pharmacy Med Name: AMLODIPINE BESYLATE 10MG  TABLETS] 90 tablet 3    Sig: TAKE 1 TABLET(10 MG) BY MOUTH DAILY     Cardiovascular:  Calcium Channel Blockers Failed - 04/24/2021  3:07 AM      Failed - Valid encounter within last 6 months    Recent Outpatient Visits           9 months ago COVID   [facility], NP   10 months ago Blood pressure check   [facility], NP   12 months ago Essential hypertension   [facility], NP   1 year ago Essential hypertension   [facility], NP   1 year ago Essential hypertension   [facility], NP              Passed - Last BP in normal range    BP Readings from Last 1 Encounters:  07/07/20 129/71

## 2021-04-25 ENCOUNTER — Other Ambulatory Visit (INDEPENDENT_AMBULATORY_CARE_PROVIDER_SITE_OTHER): Payer: Self-pay | Admitting: Primary Care

## 2021-04-25 DIAGNOSIS — I10 Essential (primary) hypertension: Secondary | ICD-10-CM

## 2021-04-25 NOTE — Telephone Encounter (Signed)
Sent to PCP ?

## 2021-06-03 ENCOUNTER — Other Ambulatory Visit (INDEPENDENT_AMBULATORY_CARE_PROVIDER_SITE_OTHER): Payer: Self-pay | Admitting: Primary Care

## 2021-06-03 DIAGNOSIS — I10 Essential (primary) hypertension: Secondary | ICD-10-CM

## 2021-06-03 NOTE — Telephone Encounter (Signed)
Requested medication (s) are due for refill today: yes  Requested medication (s) are on the active medication list: yes  Last refill:  04/26/21 #30   Future visit scheduled: no  Notes to clinic:  overdue lab work   Requested Prescriptions  Pending Prescriptions Disp Refills   hydrochlorothiazide (HYDRODIURIL) 25 MG tablet [Pharmacy Med Name: HYDROCHLOROTHIAZIDE 25MG  TABLETS] 30 tablet 0    Sig: TAKE 1 TABLET(25 MG) BY MOUTH DAILY     Cardiovascular: Diuretics - Thiazide Failed - 06/03/2021  9:51 AM      Failed - Ca in normal range and within 360 days    Calcium  Date Value Ref Range Status  03/31/2020 9.7 8.7 - 10.2 mg/dL Final          Failed - Cr in normal range and within 360 days    Creatinine, Ser  Date Value Ref Range Status  03/31/2020 1.10 0.76 - 1.27 mg/dL Final          Failed - K in normal range and within 360 days    Potassium  Date Value Ref Range Status  03/31/2020 3.9 3.5 - 5.2 mmol/L Final          Failed - Na in normal range and within 360 days    Sodium  Date Value Ref Range Status  03/31/2020 138 134 - 144 mmol/L Final          Failed - Valid encounter within last 6 months    Recent Outpatient Visits           11 months ago COVID   [facility], NP   11 months ago Blood pressure check   [facility] Grayce Sessions, NP   1 year ago Essential hypertension   [facility], NP   1 year ago Essential hypertension   [facility], NP   1 year ago Essential hypertension   [facility], NP              Passed - Last BP in normal range    BP Readings from Last 1 Encounters:  07/07/20 129/71
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
## 2021-07-12 ENCOUNTER — Other Ambulatory Visit (INDEPENDENT_AMBULATORY_CARE_PROVIDER_SITE_OTHER): Payer: Self-pay | Admitting: Primary Care

## 2021-07-12 DIAGNOSIS — I10 Essential (primary) hypertension: Secondary | ICD-10-CM

## 2021-08-01 ENCOUNTER — Ambulatory Visit (INDEPENDENT_AMBULATORY_CARE_PROVIDER_SITE_OTHER): Payer: Self-pay | Admitting: Primary Care

## 2021-08-01 ENCOUNTER — Other Ambulatory Visit: Payer: Self-pay

## 2021-08-01 ENCOUNTER — Encounter (INDEPENDENT_AMBULATORY_CARE_PROVIDER_SITE_OTHER): Payer: Self-pay | Admitting: Primary Care

## 2021-08-01 VITALS — BP 146/81 | HR 69 | Temp 98.1°F | Ht 70.0 in | Wt 201.4 lb

## 2021-08-01 DIAGNOSIS — E663 Overweight: Secondary | ICD-10-CM

## 2021-08-01 DIAGNOSIS — I10 Essential (primary) hypertension: Secondary | ICD-10-CM

## 2021-08-01 MED ORDER — AMLODIPINE BESYLATE 10 MG PO TABS: 10.0000 mg | ORAL_TABLET | Freq: Every day | ORAL | 1 refills | Status: DC

## 2021-08-01 MED ORDER — HYDROCHLOROTHIAZIDE 25 MG PO TABS: ORAL_TABLET | ORAL | 1 refills | Status: DC

## 2021-08-01 NOTE — Patient Instructions (Signed)

## 2021-08-01 NOTE — Progress Notes (Signed)
[facility]   Marc Campbell is a 21 y.o. male presents for hypertension evaluation, Denies shortness of breath, headaches, chest pain or lower extremity edema, sudden onset, vision changes, unilateral weakness, dizziness, paresthesias   Patient reports adherence with medications.  Dietary habits include: not monitoring intake  Exercise habits include:yes Family / Social history: No   No past medical history on file. No past surgical history on file. No Known Allergies No current outpatient medications on file prior to visit.   No current facility-administered medications on file prior to visit.   Social History   Socioeconomic History   Marital status: Single    Spouse name: Not on file   Number of children: Not on file   Years of education: Not on file   Highest education level: Not on file  Occupational History   Not on file  Tobacco Use   Smoking status: Never   Smokeless tobacco: Never  Vaping Use   Vaping Use: Never used  Substance and Sexual Activity   Alcohol use: No   Drug use: No   Sexual activity: Never  Other Topics Concern   Not on file  Social History Narrative   Not on file   Social Determinants of Health   Financial Resource Strain: Not on file  Food Insecurity: Not on file  Transportation Needs: Not on file  Physical Activity: Not on file  Stress: Not on file  Social Connections: Not on file  Intimate Partner Violence: Not on file   Family History  Problem Relation Age of Onset   Marc Campbell    Marc Campbell    Marc Campbell    Cancer Neg Hx    Heart disease Neg Hx    Stroke Neg Hx      OBJECTIVE:  Vitals:   08/01/21 1117  BP: (!) 146/81  Pulse: 69  Temp: 98.1 F (36.7 C)  TempSrc: Oral  SpO2: 98%  Weight: 201 lb 6.4 oz (91.4 kg)  Height: 5\' 10"  (1.778 m)   Physical exam: General: Vital signs reviewed.  Patient is well-developed and well-nourished, over weight in no acute distress and  cooperative with exam. Head: Normocephalic and atraumatic. Eyes: EOMI, conjunctivae normal, no scleral icterus. Neck: Supple, trachea midline, normal ROM, no JVD, masses, thyromegaly, or carotid bruit present. Cardiovascular: RRR, S1 normal, S2 normal, no murmurs, gallops, or rubs. Pulmonary/Chest: Clear to auscultation bilaterally, no wheezes, rales, or rhonchi. Abdominal: Soft, non-tender, non-distended, BS +, no masses, organomegaly, or guarding present. Musculoskeletal: No joint deformities, erythema, or stiffness, ROM full and nontender. Extremities: No lower extremity edema bilaterally,  pulses symmetric and intact bilaterally. No cyanosis or clubbing. Neurological: A&O x3, Strength is normal Skin: Warm, dry and intact. No rashes or erythema. Psychiatric: Normal mood and affect. speech and behavior is normal. Cognition and memory are normal.     ROS Comprehensive ROS Pertinent positive and negative noted in HPI   Last 3 Office BP readings: BP Readings from Last 3 Encounters:  08/01/21 (!) 146/81  07/07/20 129/71  06/09/20 133/77    BMET    Component Value Date/Time   NA 138 03/31/2020 1118   K 3.9 03/31/2020 1118   CL 99 03/31/2020 1118   CO2 27 03/31/2020 1118   GLUCOSE 82 03/31/2020 1118   BUN 15 03/31/2020 1118   CREATININE 1.10 03/31/2020 1118   CALCIUM 9.7 03/31/2020 1118   GFRNONAA 95 03/31/2020 1118   GFRAA 110 03/31/2020 1118   Meds ordered this  encounter  Medications   amLODipine (NORVASC) 10 MG tablet    Sig: Take 1 tablet (10 mg total) by mouth daily.    Dispense:  90 tablet    Refill:  1   hydrochlorothiazide (HYDRODIURIL) 25 MG tablet    Sig: Take one tablet take daily    Dispense:  90 tablet    Refill:  1   Renal function: CrCl cannot be calculated (Patient's most recent lab result is older than the maximum 21 days allowed.).  Clinical ASCVD: No  The ASCVD Risk score (Arnett DK, et al., 2019) failed to calculate for the following reasons:   The  2019 ASCVD risk score is only valid for ages 65 to 14  ASCVD risk factors include- Marc Campbell   ASSESSMENT & PLAN: Marc Campbell was seen today for blood pressure check.  Diagnoses and all orders for this visit:  Overweight (BMI 25.0-29.9) Discussed diet and exercise for person with BMI >25. Instructed: You must burn more calories than you eat. Losing 5 percent of your body weight should be considered a success. In the longer term, losing more than 15 percent of your body weight and staying at this weight is an extremely good result. However, keep in mind that even losing 5 percent of your body weight leads to important health benefits, so try not to get discouraged if you're not able to lose more than this. Will recheck weight in 3-6 months.   Essential hypertension -Counseled on lifestyle modifications for blood pressure control including reduced dietary sodium, increased exercise, weight reduction and adequate sleep. Also, educated patient about the risk for cardiovascular events, stroke and heart attack. Also counseled patient about the importance of medication adherence. If you participate in smoking, it is important to stop using tobacco as this will increase the risks associated with uncontrolled blood pressure.   -Hypertension longstanding diagnosed currently  on current medications. Patient is not adherent with current medications.   Goal BP:  For patients younger than 60: Goal BP < 130/80. For patients 60 and older: Goal BP < 140/90. For patients with diabetes: Goal BP < 130/80. Your most recent BP: 146/81  Minimize salt intake. Minimize alcohol intake   amLODipine (NORVASC) 10 MG tablet; Take 1 tablet (10 mg total) by mouth daily. -     hydrochlorothiazide (HYDRODIURIL) 25 MG tablet; Take one tablet take daily   This note has been created with Education officer, environmental. Any transcriptional errors are unintentional.   Grayce Sessions, NP 08/01/2021,  11:34 AM

## 2021-09-12 ENCOUNTER — Encounter (INDEPENDENT_AMBULATORY_CARE_PROVIDER_SITE_OTHER): Payer: Self-pay | Admitting: Primary Care

## 2021-09-12 ENCOUNTER — Other Ambulatory Visit: Payer: Self-pay

## 2021-09-12 ENCOUNTER — Ambulatory Visit (INDEPENDENT_AMBULATORY_CARE_PROVIDER_SITE_OTHER): Payer: Self-pay | Admitting: Primary Care

## 2021-09-12 VITALS — BP 128/76 | HR 92 | Temp 97.9°F | Ht 70.0 in | Wt 187.2 lb

## 2021-09-12 DIAGNOSIS — F4323 Adjustment disorder with mixed anxiety and depressed mood: Secondary | ICD-10-CM

## 2021-09-12 DIAGNOSIS — S42402A Unspecified fracture of lower end of left humerus, initial encounter for closed fracture: Secondary | ICD-10-CM

## 2021-09-12 DIAGNOSIS — I10 Essential (primary) hypertension: Secondary | ICD-10-CM

## 2021-09-12 NOTE — Progress Notes (Signed)
?  [facility] ? ? ?Subjective:  ? Marc Campbell is a 21 y.o. male presents for hospital follow up and Bp management . 08/07/21 he fell while trying to put up solar panels in his house and call 911 and the ambulance took him to [facility] where he was dx with fractures of the elbow and wrist. He was instructed to call and set up an appt for PT. Due to not having Care everywhere information obtained from patient. Left fingers swollen able to move and warm.  ? ?No past medical history on file.  ? ?No Known Allergies ? ?  ?Current Outpatient Medications on File Prior to Visit  ?Medication Sig Dispense Refill  ? amLODipine (NORVASC) 10 MG tablet Take 1 tablet (10 mg total) by mouth daily. 90 tablet 1  ? hydrochlorothiazide (HYDRODIURIL) 25 MG tablet Take one tablet take daily 90 tablet 1  ? ?No current facility-administered medications on file prior to visit.  ? ?Review of System: ?Comprehensive ROS Pertinent positive and negative noted in HPI   ? ?Objective:  ?BP 128/76 (BP Location: Right Arm, Patient Position: Sitting, Cuff Size: Normal)   Pulse 92   Temp 97.9 ?F (36.6 ?C) (Oral)   Ht 5\' 10"  (1.778 m)   Wt 187 lb 3.2 oz (84.9 kg)   SpO2 95%   BMI 26.86 kg/m?  ? Weights  ? 09/12/21 1117  ?Weight: 187 lb 3.2 oz (84.9 kg)  ? ? ?Physical Exam: ?General Appearance: Well nourished, in no apparent distress. ?Eyes: PERRLA, EOMs, conjunctiva no swelling or erythema ?Sinuses: No Frontal/maxillary tenderness ?ENT/Mouth: Ext aud canals clear, TMs without erythema, bulging.  Hearing normal.  ?Neck: Supple, thyroid normal.  ?Respiratory: Respiratory effort normal, BS equal bilaterally without rales, rhonchi, wheezing or stridor.  ?Cardio: RRR with no MRGs. Brisk peripheral pulses without edema.  ?Abdomen: Soft, + BS.  Non tender, no guarding, rebound, hernias, masses. ?Lymphatics: Non tender without lymphadenopathy.  ?Musculoskeletal: Full ROM, 5/5 strength,right side side and normal gait. Left arm in  a cast from elbow to wrist. ?Skin: Warm, dry without rashes, lesions, ecchymosis.  ?Neuro: Cranial nerves intact. Normal muscle tone, no cerebellar symptoms. Sensation intact.  ?Psych: Awake and oriented X 3, normal affect, Insight and Judgment appropriate.  ?Assessment:  ?Marc Campbell was seen today for blood pressure check. ? ?Diagnoses and all orders for this visit: ? ?Essential hypertension ?BP is at goal - < 130/80 ?Explained that having normal blood pressure is the goal and medications are helping to get to goal and maintain normal blood pressure. ?DIET: Limit salt intake, read nutrition labels to check salt content, limit fried and high fatty foods  ?Avoid using multisymptom OTC cold preparations that generally contain sudafed which can rise BP. Consult with pharmacist on best cold relief products to use for persons with HTN ?EXERCISE ?Discussed incorporating exercise such as walking - 30 minutes most days of the week and can do in 10 minute intervals    ? ?Left elbow fracture, closed, initial encounter ?Followed by ortho per patient  ?  ?Adjustment disorder with mixed anxiety and depressed mood ?Discussed options will return in 6 weeks  ? ?This note has been created with Jonetta Speak. Any transcriptional errors are unintentional.  ? ?Education officer, environmental, NP ?09/12/2021, 11:24 AM ?  ? ?

## 2021-11-07 ENCOUNTER — Telehealth (INDEPENDENT_AMBULATORY_CARE_PROVIDER_SITE_OTHER): Payer: Self-pay | Admitting: Primary Care

## 2021-11-07 DIAGNOSIS — I10 Essential (primary) hypertension: Secondary | ICD-10-CM

## 2021-11-07 NOTE — Progress Notes (Signed)
?  [place] ? ?Virtual Visit  ?I connected with JOURNEE LICO, on 11/07/2021 at 5:10 PM through an audio and video application and verified that I am speaking with the correct person using two identifiers. ?  ?Consent: ?I discussed the limitations, risks, security and privacy concerns of performing an evaluation and management service by telephone and the availability of in person appointments. I also discussed with the patient that there may be a patient responsible charge related to this service. The patient expressed understanding and agreed to proceed. ? ? ?Location of Patient: ?Home  ? ?Location of Provider: ?[facility] [place]  ? ?Persons participating in Telemedicine visit: ?MALCOME SLAGHT ?[name],  NP ? ?History of Present Illness: ?Mr. Christin Bach. Byram is a 21 year old male follow up for hypertension. Previous visit added  ?  ?No past medical history on file. ?No Known Allergies ? ?Current Outpatient Medications on File Prior to Visit  ?Medication Sig Dispense Refill  ? amLODipine (NORVASC) 10 MG tablet Take 1 tablet (10 mg total) by mouth daily. 90 tablet 1  ? hydrochlorothiazide (HYDRODIURIL) 25 MG tablet Take one tablet take daily 90 tablet 1  ? ?No current facility-administered medications on file prior to visit.  ? ? ?Observations/Objective: ? Patient has No headache, No chest pain, No abdominal pain - No Nausea, No new weakness tingling or numbness, No Cough - shortness of breath ? ? ?Assessment and Plan: ?Diagnoses and all orders for this visit: ? ?Essential hypertension ?Unable to check Bp at home no problems taking Bp medication. Endorses taking daily. Counseled on blood pressure goal of less than 12080, low-sodium, DASH diet, medication compliance, 150 minutes of moderate intensity exercise per week. ?Discussed medication compliance, adverse effects.  ?  ? ?Follow Up Instructions: ?Schedule f/u appt in person to ck Bp and  labs ?  ?I discussed the assessment and treatment plan with the patient. The patient was provided an opportunity to ask questions and all were answered. The patient agreed with the plan and demonstrated an understanding of the instructions. ?  ?The patient was advised to call back or seek an in-person evaluation if the symptoms worsen or if the condition fails to improve as anticipated. ? ? ? ? ?I provided 15 minutes total of non-face-to-face time during this encounter including median intraservice time, reviewing previous notes, investigations, ordering medications, medical decision making, coordinating care and patient verbalized understanding at the end of the visit. ? ? ? ?This note has been created with Surveyor, quantity. Any transcriptional errors are unintentional.  ? ?Kerin Perna, NP ?11/07/2021, 5:10 PM  ?

## 2021-12-03 ENCOUNTER — Encounter (INDEPENDENT_AMBULATORY_CARE_PROVIDER_SITE_OTHER): Payer: Self-pay | Admitting: Primary Care

## 2021-12-03 ENCOUNTER — Other Ambulatory Visit (HOSPITAL_COMMUNITY)
Admission: RE | Admit: 2021-12-03 | Discharge: 2021-12-03 | Disposition: A | Discharge status: Home / Self Care | Payer: Self-pay | Source: Ambulatory Visit | Attending: Primary Care | Admitting: Primary Care

## 2021-12-03 ENCOUNTER — Ambulatory Visit (INDEPENDENT_AMBULATORY_CARE_PROVIDER_SITE_OTHER): Payer: Self-pay | Admitting: Primary Care

## 2021-12-03 VITALS — BP 120/74 | HR 71 | Temp 98.0°F | Ht 70.0 in | Wt 182.8 lb

## 2021-12-03 DIAGNOSIS — R5383 Other fatigue: Secondary | ICD-10-CM

## 2021-12-03 DIAGNOSIS — Z760 Encounter for issue of repeat prescription: Secondary | ICD-10-CM

## 2021-12-03 DIAGNOSIS — H539 Unspecified visual disturbance: Secondary | ICD-10-CM

## 2021-12-03 DIAGNOSIS — I10 Essential (primary) hypertension: Secondary | ICD-10-CM

## 2021-12-03 DIAGNOSIS — Z113 Encounter for screening for infections with a predominantly sexual mode of transmission: Secondary | ICD-10-CM | POA: Insufficient documentation

## 2021-12-03 MED ORDER — HYDROCHLOROTHIAZIDE 25 MG PO TABS: ORAL_TABLET | ORAL | 1 refills | Status: AC

## 2021-12-03 MED ORDER — AMLODIPINE BESYLATE 10 MG PO TABS: 10.0000 mg | ORAL_TABLET | Freq: Every day | ORAL | 1 refills | Status: AC

## 2021-12-03 NOTE — Progress Notes (Signed)
  [facility]  Marc Campbell, is a 21 y.o. male  QIO:[id number]  LKG:[id number]  DOB - 12-04-2000  Chief Complaint  Patient presents with   Hypertension    Pt is not fasting       Subjective:   [name] is a 21 y.o. male here today for a follow up visit for the management of HTN. He is concern with vision changes black spots- last eye visit 10 years ago. Patient has No headache, No chest pain, No abdominal pain - No Nausea, No new weakness tingling or numbness, No Cough - shortness of breath  No problems updated.  No Known Allergies  No past medical history on file.  No current outpatient medications on file prior to visit.   No current facility-administered medications on file prior to visit.   Comprehensive ROS Pertinent positive and negative noted in HPI   Objective:   Vitals:   12/03/21 1523  BP: 120/74  Pulse: 71  Temp: 98 F (36.7 C)  TempSrc: Oral  SpO2: 97%  Weight: 182 lb 12.8 oz (82.9 kg)  Height: 5\' 10"  (1.778 m)    Exam General appearance : Awake, alert, not in any distress. Speech Clear. Not toxic looking HEENT: Atraumatic and Normocephalic, pupils equally reactive to light and accomodation Neck: Supple, no JVD. No cervical lymphadenopathy.  Chest: Good air entry bilaterally, no added sounds  CVS: S1 S2 regular, no murmurs.  Abdomen: Bowel sounds present, Non tender and not distended with no gaurding, rigidity or rebound. Extremities: B/L Lower Ext shows no edema, both legs are warm to touch Neurology: Awake alert, and oriented X 3, CN II-XII intact, Non focal Skin: No Rash  Data Review No results found for: HGBA1C  Assessment & Plan   Malcome was seen today for hypertension  Diagnoses and all orders for this visit:  Essential hypertension Blood pressure goal of less than 130/80, low-sodium, DASH diet, medication compliance, 150 minutes of moderate intensity exercise per week. Discussed medication compliance, adverse  effects.   Screening examination for STD (sexually transmitted disease) -     Cytology (oral, anal, urethral) ancillary only  Medication refill -     hydrochlorothiazide (HYDRODIURIL) 25 MG tablet; Take one tablet take daily -     amLODipine (NORVASC) 10 MG tablet; Take 1 tablet (10 mg total) by mouth daily.  Fatigue, unspecified type -     TSH + free T4; Future -     CBC with Differential  Screening examination for STD (sexually transmitted disease) -     Cytology (oral, anal, urethral) ancillary only  Vision changes Advise to have eye exam    Patient have been counseled extensively about nutrition and exercise. Other issues discussed during this visit include: low cholesterol diet, weight control and daily exercise, foot care, annual eye examinations at Ophthalmology, importance of adherence with medications and regular follow-up. We also discussed long term complications of uncontrolled diabetes and hypertension.   No follow-ups on file.  The patient was given clear instructions to go to ER or return to medical center if symptoms don't improve, worsen or new problems develop. The patient verbalized understanding. The patient was told to call to get lab results if they haven't heard anything in the next week.   This note has been created with Jonetta Speak. Any transcriptional errors are unintentional.   Education officer, environmental, NP 12/03/2021, 4:02 PM

## 2021-12-04 LAB — CBC WITH DIFFERENTIAL/PLATELET
Basophils Absolute: 0.1 10*3/uL (ref 0.0–0.2)
Basos: 1 %
EOS (ABSOLUTE): 0.1 10*3/uL (ref 0.0–0.4)
Eos: 2 %
Hematocrit: 43.7 % (ref 37.5–51.0)
Hemoglobin: 14.3 g/dL (ref 13.0–17.7)
Immature Grans (Abs): 0 10*3/uL (ref 0.0–0.1)
Immature Granulocytes: 0 %
Lymphocytes Absolute: 2.7 10*3/uL (ref 0.7–3.1)
Lymphs: 41 %
MCH: 27.7 pg (ref 26.6–33.0)
MCHC: 32.7 g/dL (ref 31.5–35.7)
MCV: 85 fL (ref 79–97)
Monocytes Absolute: 0.5 10*3/uL (ref 0.1–0.9)
Monocytes: 8 %
Neutrophils Absolute: 3.2 10*3/uL (ref 1.4–7.0)
Neutrophils: 48 %
Platelets: 299 10*3/uL (ref 150–450)
RBC: 5.17 x10E6/uL (ref 4.14–5.80)
RDW: 13 % (ref 11.6–15.4)
WBC: 6.6 10*3/uL (ref 3.4–10.8)

## 2021-12-04 LAB — CMP14+EGFR
ALT: 27 IU/L (ref 0–44)
AST: 19 IU/L (ref 0–40)
Albumin/Globulin Ratio: 1.8 (ref 1.2–2.2)
Albumin: 4.6 g/dL (ref 4.1–5.2)
Alkaline Phosphatase: 85 IU/L (ref 44–121)
BUN/Creatinine Ratio: 13 (ref 9–20)
BUN: 12 mg/dL (ref 6–20)
Bilirubin Total: 0.3 mg/dL (ref 0.0–1.2)
CO2: 26 mmol/L (ref 20–29)
Calcium: 9.4 mg/dL (ref 8.7–10.2)
Chloride: 109 mmol/L — ABNORMAL HIGH (ref 96–106)
Creatinine, Ser: 0.94 mg/dL (ref 0.76–1.27)
Globulin, Total: 2.6 g/dL (ref 1.5–4.5)
Glucose: 102 mg/dL — ABNORMAL HIGH (ref 70–99)
Potassium: 4.3 mmol/L (ref 3.5–5.2)
Sodium: 144 mmol/L (ref 134–144)
Total Protein: 7.2 g/dL (ref 6.0–8.5)
eGFR: 117 mL/min/{1.73_m2} (ref >59)

## 2021-12-05 LAB — CYTOLOGY, (ORAL, ANAL, URETHRAL) ANCILLARY ONLY
Chlamydia: NEGATIVE
Comment: NEGATIVE
Comment: NEGATIVE
Comment: NORMAL
Neisseria Gonorrhea: NEGATIVE
Trichomonas: NEGATIVE

## 2022-03-05 ENCOUNTER — Other Ambulatory Visit (HOSPITAL_COMMUNITY)
Admission: RE | Admit: 2022-03-05 | Discharge: 2022-03-05 | Disposition: A | Discharge status: Home / Self Care | Payer: Self-pay | Source: Ambulatory Visit | Attending: Primary Care | Admitting: Primary Care

## 2022-03-05 ENCOUNTER — Ambulatory Visit (INDEPENDENT_AMBULATORY_CARE_PROVIDER_SITE_OTHER): Payer: Self-pay | Admitting: Primary Care

## 2022-03-05 ENCOUNTER — Encounter (INDEPENDENT_AMBULATORY_CARE_PROVIDER_SITE_OTHER): Payer: Self-pay | Admitting: Primary Care

## 2022-03-05 VITALS — BP 122/80 | HR 90 | Temp 99.1°F | Resp 17 | Ht 71.0 in | Wt 183.0 lb

## 2022-03-05 DIAGNOSIS — I10 Essential (primary) hypertension: Secondary | ICD-10-CM

## 2022-03-05 DIAGNOSIS — Z113 Encounter for screening for infections with a predominantly sexual mode of transmission: Secondary | ICD-10-CM | POA: Insufficient documentation

## 2022-03-05 DIAGNOSIS — Z23 Encounter for immunization: Secondary | ICD-10-CM

## 2022-03-05 DIAGNOSIS — Z833 Family history of diabetes mellitus: Secondary | ICD-10-CM

## 2022-03-05 LAB — POCT GLYCOSYLATED HEMOGLOBIN (HGB A1C): Hemoglobin A1C: 5.4 % (ref 4.0–5.6)

## 2022-03-05 NOTE — Patient Instructions (Signed)
Preventing Sexually Transmitted Infections, Adult Sexually transmitted infections (STIs) are spread from person to person (are contagious). They are spread, or transmitted, through bodily fluids exchanged during sex or sexual contact. These bodily fluids include saliva, semen, blood, vaginal mucus, and urine. STIs are very common and can occur among people of all ages. Some common STIs include: Herpes. Hepatitis B. Chlamydia. Gonorrhea. Syphilis. HPV (human papillomavirus). HIV, also called the human immunodeficiency virus. This is the virus that can cause AIDS (acquired immunodeficiency syndrome). Often, people who have these STIs do not have symptoms. Even without symptoms, these infections can be spread from person to person and require treatment. How can these conditions affect me? STIs can be treated, and many STIs can be cured. However, some STIs cannot be cured and will affect you for the rest of your life. Certain STIs may: Require you to take medicine for the rest of your life. Affect your ability to have children (your fertility). Increase your risk for developing another STI or certain serious health conditions. These may include: Cervical cancer. Head and neck cancer. Pelvic inflammatory disease (PID), in women. Organ damage or damage to other parts of your body, if the infection spreads. Cause problems during pregnancy and may be transmitted to the baby during the pregnancy or childbirth. What can increase my risk? You may have an increased risk for developing an STI if: You have unprotected sex or sexual contact. You have more than one sex partner. You have a sex partner who has multiple sex partners. You have sexual contact with someone who has an STI. You have an STI or you had an STI before. You inject drugs or have a sex partner or partners who inject drugs. What actions can I take to prevent STIs? The only way to completely prevent STIs is not to have sex of any  kind. This is called practicing abstinence. If you are sexually active, you can protect yourself and others by taking these actions to lower your risk of getting an STI: Lifestyle Avoid mixing alcohol, drugs, and sex. Alcohol and drug use can affect your ability to make good decisions and can lead to risky sexual behaviors. Medicines Ask your health care provider about taking pre-exposure prophylaxis (PrEP) to prevent HIV infection. General information  Stay up to date on vaccinations. Certain vaccines can lower your risk of getting certain STIs, such as: Hepatitis A and hepatitis B vaccines. HPV (human papillomavirus) vaccine. Have only one sex partner (be monogamous) or limit the number of sex partners you have. Use methods that prevent the exchange of body fluids between partners (barrier protection) correctly every time you have sexual contact. Barrier protection can be used during oral, vaginal, or anal sex. Commonly used barrier methods include: External (male) condom. Internal (male) condom. Dental dam. Use a new barrier method for every sex act from start to finish. Get tested for STIs. Have your partners get tested, too. If you test positive for an STI, follow recommendations from your health care provider about treatment and make sure your sex partners are tested and treated. Birth control pills, injections, implants, and intrauterine devices (IUDs) do not protect against STIs. To prevent both STIs and pregnancy, always use a condom with another form of birth control. Some STIs, such as herpes, are spread through skin-to-skin contact. A barrier method may not protect you from getting such STIs. Avoid all sexual contact if you or your partners have herpes and there is an active flare with open sores. Where to   find more information Learn more about STIs from: Centers for Disease Control and Prevention: More information about specific STIs: BloggingList.ca Places to get sexual health  counseling and treatment for free or at a low cost: gettested.TonerPromos.no U.S. Department of Health and Human Services: http://hoffman.com/ Summary Sexually transmitted infections (STIs) can spread through exchanging bodily fluids during sexual contact. Fluids include saliva, semen, blood, vaginal mucus, and urine. You may have an increased risk for developing an STI if you have unprotected sex. If you do have sex, limit your number of sex partners and use barrier protection every time you have sex. This information is not intended to replace advice given to you by your health care provider. Make sure you discuss any questions you have with your health care provider. Document Revised: 10/15/2021 Document Reviewed: 08/02/2019 Elsevier Patient Education  2023 Elsevier Inc. HPV Vaccine Information for Parents  Human papillomavirus, or HPV, is a common virus that spreads easily from person to person through skin-to-skin or sexual contact. There are many types of HPV viruses. They can cause warts in the genitals (genital or mucosal HPV), or on the hands or feet (cutaneous or nonmucosal HPV). Some genital HPV types are considered high-risk and may cause cancer. Your child can get a vaccination to help prevent certain HPV infections that can cause cancer as well as those types that cause genital and anal warts. The vaccine is safe and effective. It is recommended for boys and girls at about 108-83 years of age. Getting the vaccine at this age (before he or she is sexually active) gives your child the best protection from HPV infection through adulthood. How can HPV affect my child? HPV infection can cause: Genital warts. Mouth or throat cancer. Anal cancer. Cervical, vulvar, or vaginal cancer. Penile cancer. During pregnancy, HPV infection can be passed to the baby. This infection can cause warts to develop in a baby's throat and mouth. What actions can I take to lower my child's risk for HPV? To lower  your child's risk for genital HPV infection, have him or her get the HPV vaccine before becoming sexually active. The best time for vaccination is between ages 42 and 61, though it can be given to children as young as 41 years old. If your child gets the first dose before age 68, the vaccination can be given as 2 shots, 6-12 months apart. In some situations, 3 doses are needed. If your child starts the vaccine before age 102 but does not have a second dose within 6-12 months after the first dose, he or she will need 3 doses to complete the vaccination. When your child has the first dose, it is important to make an appointment for the next shot and keep the appointment. Teens who are not vaccinated before age 34 will need 3 doses, within six months of the first dose. If your child has a weak immune system, he or she may need 3 doses. What are the risks and benefits of the HPV vaccine? Benefits The main benefit of getting vaccinated is to prevent certain cancers, including: Cervical, vulvar, and vaginal cancer in females. Penile cancer in males. Oral and anal cancer in both males and females. The risk of these cancers is lower if your child gets vaccinated before he or she becomes sexually active. The vaccine also prevents genital warts caused by HPV. Risks The risks, although low, include side effects or reactions to the vaccine. Very few reactions have been reported, but they can include: Soreness, redness,  or swelling at the injection site. Dizziness or fainting. Fever. Nausea. Muscle or joint pain. Who should not get the HPV vaccine or should wait to get it? Some children should not get the HPV vaccine or should wait. Discuss the risks and benefits of the vaccine with your child's health care provider if your child: Has had a severe allergic reaction to other vaccinations. Is allergic to yeast. Has a fever. Has had a recent illness. Is pregnant or may be pregnant. Where to find more  information Centers for Disease Control and Prevention: TonerPromos.no American Academy of Pediatrics: healthychildren.org Summary HPV is a common virus that spreads from person to person through skin-to-skin or sexual contact. It can spread during vaginal, anal, or oral sex. Your child can get a vaccination to prevent HPV infection and cancer. It is best to get the vaccination before becoming sexually active. The HPV vaccine can protect your child from genital warts and certain types of cancer, including cancer of the cervix, throat, mouth, vulva, vagina, anus, and penis. The HPV vaccine is both safe and effective. This information is not intended to replace advice given to you by your health care provider. Make sure you discuss any questions you have with your health care provider. Document Revised: 04/12/2021 Document Reviewed: 04/12/2021 Elsevier Patient Education  2023 [employer].

## 2022-03-05 NOTE — Progress Notes (Signed)
[facility]  Marc Campbell, is a 21 y.o. male  FUX:[id number]  GUR:[id number]  DOB - [birth year]  Chief Complaint  Patient presents with   Hypertension    Patient reports taking meds but not checking bp at home regularly       Subjective:   Marc Campbell is a 21 y.o. male here today for a follow up visit for hypertension. Patient BP is unremarkable and admits to not taking his medication. Patient has No headache, No chest pain, No abdominal pain - No Nausea, No new weakness tingling or numbness, No Cough - shortness of breath.  Patient is concerned about STI screening states he has been in a relationship for 2 years and his girlfriend would like to know if he has any STI.  Patient states she is a virgin.  No problems updated.  No Known Allergies  History reviewed. No pertinent past medical history.  Current Outpatient Medications on File Prior to Visit  Medication Sig Dispense Refill   amLODipine (NORVASC) 10 MG tablet Take 1 tablet (10 mg total) by mouth daily. 90 tablet 1   hydrochlorothiazide (HYDRODIURIL) 25 MG tablet Take one tablet take daily 90 tablet 1   No current facility-administered medications on file prior to visit.    Objective:   Vitals:   03/05/22 1531  BP: 122/80  Pulse: 90  Resp: 17  Temp: 99.1 F (37.3 C)  SpO2: 95%  Weight: 183 lb (83 kg)  Height: 5\' 11"  (1.803 m)    Exam General appearance : Awake, alert, not in any distress. Speech Clear. Not toxic looking HEENT: Atraumatic and Normocephalic, pupils equally reactive to light and accomodation Neck: Supple, no JVD. No cervical lymphadenopathy.  Chest: Good air entry bilaterally, no added sounds  CVS: S1 S2 regular, no murmurs.  Abdomen: Bowel sounds present, Non tender and not distended with no gaurding, rigidity or rebound. Extremities: B/L Lower Ext shows no edema, both legs are warm to touch Neurology: Awake alert, and oriented X 3, CN II-XII intact, Non focal Skin: No  Rash  Data Review No results found for: "HGBA1C"  Assessment & Plan   There are no diagnoses linked to this encounter. Marc Campbell was seen today for hypertension.  Diagnoses and all orders for this visit:  Essential hypertension Resolved   Screening examination for STD (sexually transmitted disease) STD screening  -     Cytology (oral, anal, urethral) ancillary only -     HIV Antibody (routine testing w rflx) -     RPR  Family history of diabetes mellitus in grandfather -     HgB A1c 5.4 per ADA guidelines patient is not a diabetic  Need for HPV vaccine Patient will receive first dose of HPV Other orders -     HPV 9-valent vaccine,Recombinat  Patient have been counseled extensively about nutrition and exercise. Other issues discussed during this visit include: low cholesterol diet, weight control and daily exercise, foot care, annual eye examinations at Ophthalmology, importance of adherence with medications and regular follow-up. We also discussed long term complications of uncontrolled diabetes and hypertension.   No follow-ups on file.  The patient was given clear instructions to go to ER or return to medical center if symptoms don't improve, worsen or new problems develop. The patient verbalized understanding. The patient was told to call to get lab results if they haven't heard anything in the next week.   This note has been created with Jonetta Speak.  Any transcriptional errors are unintentional.   Grayce Sessions, NP 03/05/2022, 4:06 PM

## 2022-03-06 LAB — SPECIMEN STATUS

## 2022-03-08 LAB — CYTOLOGY, (ORAL, ANAL, URETHRAL) ANCILLARY ONLY
Chlamydia: NEGATIVE
Comment: NEGATIVE
Comment: NEGATIVE
Comment: NORMAL
Neisseria Gonorrhea: NEGATIVE
Trichomonas: NEGATIVE

## 2022-03-11 LAB — SPECIMEN STATUS REPORT

## 2022-03-11 LAB — HIV ANTIBODY (ROUTINE TESTING W REFLEX): HIV Screen 4th Generation wRfx: NONREACTIVE

## 2022-03-11 LAB — RPR: RPR Ser Ql: NONREACTIVE

## 2022-06-04 ENCOUNTER — Ambulatory Visit (INDEPENDENT_AMBULATORY_CARE_PROVIDER_SITE_OTHER): Payer: Self-pay | Admitting: Primary Care

## 2023-03-23 ENCOUNTER — Emergency Department (HOSPITAL_COMMUNITY)
Admission: EM | Admit: 2023-03-23 | Discharge: 2023-03-23 | Disposition: A | Discharge status: Home / Self Care | Payer: Self-pay | Attending: Emergency Medicine | Admitting: Emergency Medicine

## 2023-03-23 ENCOUNTER — Encounter (HOSPITAL_COMMUNITY): Payer: Self-pay | Admitting: Emergency Medicine

## 2023-03-23 DIAGNOSIS — Z79899 Other long term (current) drug therapy: Secondary | ICD-10-CM | POA: Insufficient documentation

## 2023-03-23 DIAGNOSIS — M791 Myalgia, unspecified site: Secondary | ICD-10-CM | POA: Insufficient documentation

## 2023-03-23 DIAGNOSIS — R002 Palpitations: Secondary | ICD-10-CM | POA: Insufficient documentation

## 2023-03-23 DIAGNOSIS — R000 Tachycardia, unspecified: Secondary | ICD-10-CM | POA: Insufficient documentation

## 2023-03-23 DIAGNOSIS — R0600 Dyspnea, unspecified: Secondary | ICD-10-CM | POA: Insufficient documentation

## 2023-03-23 DIAGNOSIS — F410 Panic disorder [episodic paroxysmal anxiety] without agoraphobia: Secondary | ICD-10-CM | POA: Insufficient documentation

## 2023-03-23 DIAGNOSIS — F419 Anxiety disorder, unspecified: Secondary | ICD-10-CM | POA: Insufficient documentation

## 2023-03-23 LAB — CBC WITH DIFFERENTIAL/PLATELET
Abs Immature Granulocytes: 0.04 10*3/uL (ref 0.00–0.07)
Basophils Absolute: 0 10*3/uL (ref 0.0–0.1)
Basophils Relative: 0 %
Eosinophils Absolute: 0 10*3/uL (ref 0.0–0.5)
Eosinophils Relative: 0 %
HCT: 43.8 % (ref 39.0–52.0)
Hemoglobin: 14.9 g/dL (ref 13.0–17.0)
Immature Granulocytes: 0 %
Lymphocytes Relative: 13 %
Lymphs Abs: 1.5 10*3/uL (ref 0.7–4.0)
MCH: 28.7 pg (ref 26.0–34.0)
MCHC: 34 g/dL (ref 30.0–36.0)
MCV: 84.4 fL (ref 80.0–100.0)
Monocytes Absolute: 0.6 10*3/uL (ref 0.1–1.0)
Monocytes Relative: 5 %
Neutro Abs: 9.4 10*3/uL — ABNORMAL HIGH (ref 1.7–7.7)
Neutrophils Relative %: 82 %
Platelets: 301 10*3/uL (ref 150–400)
RBC: 5.19 MIL/uL (ref 4.22–5.81)
RDW: 11.9 % (ref 11.5–15.5)
WBC: 11.5 10*3/uL — ABNORMAL HIGH (ref 4.0–10.5)
nRBC: 0 % (ref 0.0–0.2)

## 2023-03-23 LAB — BASIC METABOLIC PANEL
Anion gap: 9 (ref 5–15)
BUN: 9 mg/dL (ref 6–20)
CO2: 24 mmol/L (ref 22–32)
Calcium: 9.6 mg/dL (ref 8.9–10.3)
Chloride: 105 mmol/L (ref 98–111)
Creatinine, Ser: 1.03 mg/dL (ref 0.61–1.24)
GFR, Estimated: >60 mL/min (ref >60)
Glucose, Bld: 144 mg/dL — ABNORMAL HIGH (ref 70–99)
Potassium: 4 mmol/L (ref 3.5–5.1)
Sodium: 138 mmol/L (ref 135–145)

## 2023-03-23 LAB — RAPID URINE DRUG SCREEN, HOSP PERFORMED
Amphetamines: NOT DETECTED
Barbiturates: NOT DETECTED
Benzodiazepines: NOT DETECTED
Cocaine: NOT DETECTED
Opiates: NOT DETECTED
Tetrahydrocannabinol: NOT DETECTED

## 2023-03-23 LAB — URINALYSIS, ROUTINE W REFLEX MICROSCOPIC
Bilirubin Urine: NEGATIVE
Glucose, UA: NEGATIVE mg/dL
Hgb urine dipstick: NEGATIVE
Ketones, ur: NEGATIVE mg/dL
Leukocytes,Ua: NEGATIVE
Nitrite: NEGATIVE
Protein, ur: NEGATIVE mg/dL
Specific Gravity, Urine: 1.004 — ABNORMAL LOW (ref 1.005–1.030)
pH: 9 — ABNORMAL HIGH (ref 5.0–8.0)

## 2023-03-23 LAB — MAGNESIUM: Magnesium: 2 mg/dL (ref 1.7–2.4)

## 2023-03-23 LAB — CK: Total CK: 119 U/L (ref 49–397)

## 2023-03-23 MED ORDER — LORAZEPAM 1 MG PO TABS
1.0000 mg | ORAL_TABLET | Freq: Once | ORAL | Status: AC
  Administered 2023-03-23: 1 mg via ORAL
  Filled 2023-03-23: qty 1

## 2023-03-23 MED ORDER — LACTATED RINGERS IV BOLUS
1000.0000 mL | Freq: Once | INTRAVENOUS | Status: AC
  Administered 2023-03-23: 1000 mL via INTRAVENOUS

## 2023-03-23 NOTE — ED Provider Notes (Signed)
[place] EMERGENCY DEPARTMENT AT [facility] Provider Note   CSN: [id number] Arrival date & time: 03/23/23  0108     History  Chief Complaint  Patient presents with   Anxiety    Marc Campbell is a 22 y.o. male.  Patient presents ER today with self-described panic attack.  Patient states he has anxiety has had anxiety for a while.  He states his doctors been trying to treat him and it does not help much.  He tried different medications.  Patient states for last year he has had intermittent episodes of rapid breathing, palpitations, muscle aches, anxiety feeling.  Patient states he had his episode tonight and his family dropped him off on the side the road and called EMS.  No recent illnesses.  No other complaints.   Anxiety       Home Medications Prior to Admission medications   Medication Sig Start Date End Date Taking? Authorizing Provider  amLODipine (NORVASC) 10 MG tablet Take 1 tablet (10 mg total) by mouth daily. 12/03/21   Grayce Sessions, NP  hydrochlorothiazide (HYDRODIURIL) 25 MG tablet Take one tablet take daily 12/03/21   Grayce Sessions, NP      Allergies    Patient has no known allergies.    Review of Systems   Review of Systems  Physical Exam Updated Vital Signs BP 132/87 (BP Location: Right Arm)   Pulse (!) 101   Temp 98.6 F (37 C) (Oral)   Resp 18   SpO2 98%  Physical Exam Vitals and nursing note reviewed.  Constitutional:      Appearance: He is well-developed.  HENT:     Head: Normocephalic and atraumatic.  Cardiovascular:     Rate and Rhythm: Tachycardia present.  Pulmonary:     Effort: Pulmonary effort is normal. No respiratory distress.  Abdominal:     General: There is no distension.  Musculoskeletal:        General: Normal range of motion.     Cervical back: Normal range of motion.  Neurological:     Mental Status: He is alert.     ED Results / Procedures / Treatments   Labs (all labs ordered are listed, but  only abnormal results are displayed) Labs Reviewed  CBC WITH DIFFERENTIAL/PLATELET - Abnormal; Notable for the following components:      Result Value   WBC 11.5 (*)    Neutro Abs 9.4 (*)    All other components within normal limits  BASIC METABOLIC PANEL - Abnormal; Notable for the following components:   Glucose, Bld 144 (*)    All other components within normal limits  URINALYSIS, ROUTINE W REFLEX MICROSCOPIC - Abnormal; Notable for the following components:   Color, Urine STRAW (*)    Specific Gravity, Urine 1.004 (*)    pH 9.0 (*)    All other components within normal limits  MAGNESIUM  CK  RAPID URINE DRUG SCREEN, HOSP PERFORMED    EKG None  Radiology No results found.  Procedures Procedures    Medications Ordered in ED Medications  lactated ringers bolus 1,000 mL (0 mLs Intravenous Stopped 03/23/23 0443)  LORazepam (ATIVAN) tablet 1 mg (1 mg Oral Given 03/23/23 4132)    ED Course/ Medical Decision Making/ A&P                                 Medical Decision Making Amount and/or Complexity of  Data Reviewed Labs: ordered.  Risk Prescription drug management.   Patient had CK, electrolytes and CBC checked just to evaluate for any other causes for his symptoms and abnormal vital signs.  Ativan given which seemed to help.  Patient requested a prescription however I defer to his PCP since it is a controlled medication.  Otherwise workup reassuring no indication for further workup or hospitalization at this time.  Final Clinical Impression(s) / ED Diagnoses Final diagnoses:  Palpitations  Myalgia  Dyspnea, unspecified type    Rx / DC Orders ED Discharge Orders     None         Lezlee Gills, Barbara Cower, MD 03/23/23 [phone]

## 2023-03-23 NOTE — ED Triage Notes (Signed)
Pt here from home with c/o anxiety attack , pt was in the car with family and they let him out and called 911 to bring him to the ED  , pt cooperative on arrival ., nad
